# Patient Record
Sex: Female | Born: 1965 | Hispanic: Yes | Marital: Married | Smoking: Never smoker
Health system: Southern US, Community
[De-identification: ages and names within clinical notes are randomized; demographics above are authoritative.]

## PROBLEM LIST (undated history)

## (undated) DIAGNOSIS — I10 Essential (primary) hypertension: Secondary | ICD-10-CM

## (undated) HISTORY — DX: Essential (primary) hypertension: I10

## (undated) HISTORY — PX: NOSE SURGERY: SHX723

## (undated) HISTORY — PX: TUBAL LIGATION: SHX77

## (undated) HISTORY — PX: ABDOMINAL HYSTERECTOMY: SHX81

## (undated) HISTORY — PX: OTHER SURGICAL HISTORY: SHX169

---

## 2018-12-18 DIAGNOSIS — J019 Acute sinusitis, unspecified: Secondary | ICD-10-CM | POA: Diagnosis not present

## 2018-12-18 DIAGNOSIS — J301 Allergic rhinitis due to pollen: Secondary | ICD-10-CM | POA: Diagnosis not present

## 2018-12-18 DIAGNOSIS — J3489 Other specified disorders of nose and nasal sinuses: Secondary | ICD-10-CM | POA: Diagnosis not present

## 2019-01-06 ENCOUNTER — Encounter: Payer: Self-pay | Admitting: Family Medicine

## 2019-01-06 ENCOUNTER — Ambulatory Visit: Payer: Medicaid Other | Admitting: Family Medicine

## 2019-01-06 ENCOUNTER — Other Ambulatory Visit: Payer: Self-pay

## 2019-01-06 VITALS — BP 134/85 | HR 97 | Temp 98.3°F | Ht 62.0 in | Wt 131.0 lb

## 2019-01-06 DIAGNOSIS — E010 Iodine-deficiency related diffuse (endemic) goiter: Secondary | ICD-10-CM | POA: Diagnosis not present

## 2019-01-06 DIAGNOSIS — R6889 Other general symptoms and signs: Secondary | ICD-10-CM

## 2019-01-06 DIAGNOSIS — I10 Essential (primary) hypertension: Secondary | ICD-10-CM

## 2019-01-06 DIAGNOSIS — R1310 Dysphagia, unspecified: Secondary | ICD-10-CM | POA: Diagnosis not present

## 2019-01-06 DIAGNOSIS — R0989 Other specified symptoms and signs involving the circulatory and respiratory systems: Secondary | ICD-10-CM

## 2019-01-06 DIAGNOSIS — Z7689 Persons encountering health services in other specified circumstances: Secondary | ICD-10-CM

## 2019-01-06 MED ORDER — AMLODIPINE BESYLATE 5 MG PO TABS: 5.0000 mg | ORAL_TABLET | Freq: Every day | ORAL | 1 refills | Status: DC

## 2019-01-06 MED ORDER — PANTOPRAZOLE SODIUM 40 MG PO TBEC: 40.0000 mg | DELAYED_RELEASE_TABLET | Freq: Every day | ORAL | 3 refills | Status: DC

## 2019-01-06 NOTE — Progress Notes (Signed)
BP 134/85   Pulse 97   Temp 98.3 F (36.8 C) (Oral)   Ht 5\' 2"  (1.575 m)   Wt 131 lb (59.4 kg)   SpO2 98%   BMI 23.96 kg/m    Subjective:    Patient ID: Mandy Alexander, female    DOB: [birth year], 53 y.o.   MRN: [id number]  HPI: Mandy Alexander is a 53 y.o. female  Chief Complaint  Patient presents with  . Establish Care    pt states having difficulty swallowing/eating  . Medication Refill    amlodipine 5 mg. pt states she has been out of medication since May   Patient presents today to establish care. She is deaf and we are using the virtual medical translator for sign language today.   Only known medical problem is HTN, for which she has been under good control with amlodipine 5 mg. Has been out of the medication for 2 months and since has had some high readings. Denies CP, SOB, HAs, dizziness. Does have a BP cuff at home.   Main concern today is difficulty swallowing, especially meats or chicken. This started about 6 months ago and has progressively worsened. No thyroid or GI problems known in the past, no unintentional weight loss, hx of smoking, drug use, excessive alcohol use (socially drinks wine about once a month), reflux sxs, early satiety, N/V/D, constipation, abdominal pain. Has not tried anything for sxs or sought care up to this point for this.   Last CPE was 05/2018.   Relevant past medical, surgical, family and social history reviewed and updated as indicated. Interim medical history since our last visit reviewed. Allergies and medications reviewed and updated.  Review of Systems  Per HPI unless specifically indicated above     Objective:    BP 134/85   Pulse 97   Temp 98.3 F (36.8 C) (Oral)   Ht 5\' 2"  (1.575 m)   Wt 131 lb (59.4 kg)   SpO2 98%   BMI 23.96 kg/m   Wt Readings from Last 3 Encounters:  01/06/19 131 lb (59.4 kg)    Physical Exam Vitals signs and nursing note reviewed.  Constitutional:      Appearance: Normal appearance. She is  not ill-appearing.  HENT:     Head: Atraumatic.     Mouth/Throat:     Pharynx: Oropharynx is clear. No posterior oropharyngeal erythema.  Eyes:     Extraocular Movements: Extraocular movements intact.     Conjunctiva/sclera: Conjunctivae normal.  Neck:     Musculoskeletal: Normal range of motion and neck supple.     Comments: Diffuse thyromegaly noted on exam, nontender and no obvious nodules palpable. Symmetric with swallowing Cardiovascular:     Rate and Rhythm: Normal rate and regular rhythm.     Heart sounds: Normal heart sounds.  Pulmonary:     Effort: Pulmonary effort is normal.     Breath sounds: Normal breath sounds.  Abdominal:     General: Bowel sounds are normal.     Palpations: Abdomen is soft.     Tenderness: There is no abdominal tenderness. There is no guarding.  Musculoskeletal: Normal range of motion.  Lymphadenopathy:     Cervical: No cervical adenopathy.  Skin:    General: Skin is warm and dry.  Neurological:     Mental Status: She is alert and oriented to person, place, and time.  Psychiatric:        Mood and Affect: Mood normal.  Thought Content: Thought content normal.        Judgment: Judgment normal.     Results for orders placed or performed in visit on 01/06/19  CBC with Differential/Platelet  Result Value Ref Range   WBC 8.9 3.4 - 10.8 x10E3/uL   RBC 4.37 3.77 - 5.28 x10E6/uL   Hemoglobin 12.1 11.1 - 15.9 g/dL   Hematocrit 36.0 34.0 - 46.6 %   MCV 82 79 - 97 fL   MCH 27.7 26.6 - 33.0 pg   MCHC 33.6 31.5 - 35.7 g/dL   RDW 12.9 11.7 - 15.4 %   Platelets 314 150 - 450 x10E3/uL   Neutrophils 80 Not Estab. %   Lymphs 14 Not Estab. %   Monocytes 5 Not Estab. %   Eos 1 Not Estab. %   Basos 0 Not Estab. %   Neutrophils Absolute 7.1 (H) 1.4 - 7.0 x10E3/uL   Lymphocytes Absolute 1.2 0.7 - 3.1 x10E3/uL   Monocytes Absolute 0.5 0.1 - 0.9 x10E3/uL   EOS (ABSOLUTE) 0.0 0.0 - 0.4 x10E3/uL   Basophils Absolute 0.0 0.0 - 0.2 x10E3/uL   Immature  Granulocytes 0 Not Estab. %   Immature Grans (Abs) 0.0 0.0 - 0.1 x10E3/uL  TSH  Result Value Ref Range   TSH <0.006 (L) 0.450 - 4.500 uIU/mL      Assessment & Plan:   Problem List Items Addressed This Visit      Cardiovascular and Mediastinum   Essential hypertension - Primary    Restart amlodipine and recheck in 2 weeks. Check home readings and call with persistent abnormal readings or side effects      Relevant Medications   amLODipine (NORVASC) 5 MG tablet    Other Visit Diagnoses    Encounter to establish care       Throat fullness       Relevant Orders   CBC with Differential/Platelet (Completed)   Thyromegaly       Will obtain TSH, no known hx of thyroid dz. Will proceed based on results.    Relevant Orders   TSH (Completed)   Dysphagia, unspecified type       Potentially due to thyromegaly but will start protonix in case reflux inflammation and refer to GI if thyroid studies WNL and issue persists       Follow up plan: Return in about 2 weeks (around 01/20/2019) for BP, swallowing f/u.

## 2019-01-07 ENCOUNTER — Telehealth: Payer: Self-pay | Admitting: Family Medicine

## 2019-01-07 DIAGNOSIS — I10 Essential (primary) hypertension: Secondary | ICD-10-CM | POA: Insufficient documentation

## 2019-01-07 DIAGNOSIS — E010 Iodine-deficiency related diffuse (endemic) goiter: Secondary | ICD-10-CM

## 2019-01-07 DIAGNOSIS — R1310 Dysphagia, unspecified: Secondary | ICD-10-CM

## 2019-01-07 DIAGNOSIS — E0590 Thyrotoxicosis, unspecified without thyrotoxic crisis or storm: Secondary | ICD-10-CM

## 2019-01-07 LAB — CBC WITH DIFFERENTIAL/PLATELET
Basophils Absolute: 0 10*3/uL (ref 0.0–0.2)
Basos: 0 %
EOS (ABSOLUTE): 0 10*3/uL (ref 0.0–0.4)
Eos: 1 %
Hematocrit: 36 % (ref 34.0–46.6)
Hemoglobin: 12.1 g/dL (ref 11.1–15.9)
Immature Grans (Abs): 0 10*3/uL (ref 0.0–0.1)
Immature Granulocytes: 0 %
Lymphocytes Absolute: 1.2 10*3/uL (ref 0.7–3.1)
Lymphs: 14 %
MCH: 27.7 pg (ref 26.6–33.0)
MCHC: 33.6 g/dL (ref 31.5–35.7)
MCV: 82 fL (ref 79–97)
Monocytes Absolute: 0.5 10*3/uL (ref 0.1–0.9)
Monocytes: 5 %
Neutrophils Absolute: 7.1 10*3/uL — ABNORMAL HIGH (ref 1.4–7.0)
Neutrophils: 80 %
Platelets: 314 10*3/uL (ref 150–450)
RBC: 4.37 x10E6/uL (ref 3.77–5.28)
RDW: 12.9 % (ref 11.7–15.4)
WBC: 8.9 10*3/uL (ref 3.4–10.8)

## 2019-01-07 LAB — TSH: TSH: <0.006 u[IU]/mL — ABNORMAL LOW (ref 0.450–4.500)

## 2019-01-07 MED ORDER — METHIMAZOLE 5 MG PO TABS: 5.0000 mg | ORAL_TABLET | Freq: Three times a day (TID) | ORAL | 0 refills | Status: DC

## 2019-01-07 NOTE — Telephone Encounter (Signed)
Message relayed to [employer], son, as pt is deaf. Son verbalized understanding and denied questions. Will call back if his mother has any questions or concerns.

## 2019-01-07 NOTE — Telephone Encounter (Signed)
Please let Mandy Alexander know that Mandy Alexander thyroid level was significantly low so I have ordered some ultrasound testing on Mandy Alexander thyroid as well as an urgent referral to see an Endocrinologist. I would also like for Mandy Alexander to start methimazole to help with this until she sees the specialist.   Judson Roch - note that I placed an urgent Endocrinology referral for Mandy Alexander. Thanks!

## 2019-01-07 NOTE — Assessment & Plan Note (Signed)
Restart amlodipine and recheck in 2 weeks. Check home readings and call with persistent abnormal readings or side effects

## 2019-01-21 ENCOUNTER — Encounter: Admission: RE | Admit: 2019-01-21 | Payer: Medicaid Other | Source: Ambulatory Visit

## 2019-01-21 ENCOUNTER — Other Ambulatory Visit: Payer: Self-pay

## 2019-01-21 ENCOUNTER — Ambulatory Visit
Admission: RE | Admit: 2019-01-21 | Discharge: 2019-01-21 | Disposition: A | Discharge status: Home / Self Care | Payer: Medicaid Other | Source: Ambulatory Visit | Attending: Family Medicine | Admitting: Family Medicine

## 2019-01-21 DIAGNOSIS — E0590 Thyrotoxicosis, unspecified without thyrotoxic crisis or storm: Secondary | ICD-10-CM | POA: Insufficient documentation

## 2019-01-21 DIAGNOSIS — R1310 Dysphagia, unspecified: Secondary | ICD-10-CM | POA: Insufficient documentation

## 2019-01-21 DIAGNOSIS — E010 Iodine-deficiency related diffuse (endemic) goiter: Secondary | ICD-10-CM | POA: Insufficient documentation

## 2019-01-22 ENCOUNTER — Ambulatory Visit: Payer: Medicaid Other | Admitting: Family Medicine

## 2019-01-22 ENCOUNTER — Telehealth: Payer: Self-pay | Admitting: Family Medicine

## 2019-01-22 ENCOUNTER — Ambulatory Visit: Admission: RE | Admit: 2019-01-22 | Payer: Medicaid Other | Source: Ambulatory Visit

## 2019-01-22 NOTE — Telephone Encounter (Signed)
Please call patient and remind her that her u/s was today and have them reschedule for as soon as possible  Copied from [place] [id number]. Topic: General - Other >> Jan 22, 2019 11:06 AM Berneta Levins wrote: Reason for CRM:   Maudie Mercury from [facility] ultrasound department calling to report that pt did not show for thyroid ultrasound today. Maudie Mercury can be reached at [phone]

## 2019-01-22 NOTE — Telephone Encounter (Signed)
Called pt's son Vira Agar to advise of provider's message, he states that her u/s has already been rescheduled.

## 2019-02-04 ENCOUNTER — Encounter
Admission: RE | Admit: 2019-02-04 | Discharge: 2019-02-04 | Disposition: A | Discharge status: Home / Self Care | Payer: Medicaid Other | Source: Ambulatory Visit | Attending: Family Medicine | Admitting: Family Medicine

## 2019-02-04 ENCOUNTER — Other Ambulatory Visit: Payer: Self-pay

## 2019-02-04 DIAGNOSIS — E0590 Thyrotoxicosis, unspecified without thyrotoxic crisis or storm: Secondary | ICD-10-CM | POA: Insufficient documentation

## 2019-02-04 DIAGNOSIS — E010 Iodine-deficiency related diffuse (endemic) goiter: Secondary | ICD-10-CM | POA: Diagnosis not present

## 2019-02-04 DIAGNOSIS — R1310 Dysphagia, unspecified: Secondary | ICD-10-CM

## 2019-02-04 MED ORDER — SODIUM IODIDE I-123 7.4 MBQ CAPS
438.7690 | ORAL_CAPSULE | Freq: Once | ORAL | Status: AC
  Administered 2019-02-04: 09:00:00 438.769 via ORAL

## 2019-02-05 ENCOUNTER — Encounter
Admission: RE | Admit: 2019-02-05 | Discharge: 2019-02-05 | Disposition: A | Discharge status: Home / Self Care | Payer: Medicaid Other | Source: Ambulatory Visit | Attending: Family Medicine | Admitting: Family Medicine

## 2019-02-05 DIAGNOSIS — R1310 Dysphagia, unspecified: Secondary | ICD-10-CM | POA: Insufficient documentation

## 2019-02-05 DIAGNOSIS — E0590 Thyrotoxicosis, unspecified without thyrotoxic crisis or storm: Secondary | ICD-10-CM | POA: Diagnosis not present

## 2019-02-05 DIAGNOSIS — E010 Iodine-deficiency related diffuse (endemic) goiter: Secondary | ICD-10-CM | POA: Insufficient documentation

## 2019-02-07 ENCOUNTER — Other Ambulatory Visit: Payer: Self-pay | Admitting: Family Medicine

## 2019-02-07 NOTE — Telephone Encounter (Signed)
Requested medication (s) are due for refill today: yes  Requested medication (s) are on the active medication list: yes  Last refill:  01/07/19  Future visit scheduled: yes  Notes to clinic:  Medication not delegated to NT to refill   Requested Prescriptions  Pending Prescriptions Disp Refills   methimazole (TAPAZOLE) 5 MG tablet [Pharmacy Med Name: METHIMAZOLE 5 MG TABLET] 90 tablet 0    Sig: TAKE 1 TABLET BY MOUTH THREE TIMES A DAY     Not Delegated - Endocrinology:  Hyperthyroid Agents Failed - 02/07/2019  2:31 PM      Failed - This refill cannot be delegated      Failed - TSH in normal range and within 180 days    TSH  Date Value Ref Range Status  01/06/2019 <0.006 (L) 0.450 - 4.500 uIU/mL Final         Passed - Valid encounter within last 6 months    Recent Outpatient Visits          1 month ago Essential hypertension   [facility] [place], [place]      Future Appointments            In 4 days Orene Desanctis, Lilia Argue, [place], [place]

## 2019-02-08 ENCOUNTER — Ambulatory Visit: Payer: Medicaid Other | Admitting: Family Medicine

## 2019-02-08 NOTE — Telephone Encounter (Signed)
Routing to provider  

## 2019-02-10 ENCOUNTER — Ambulatory Visit: Payer: Medicaid Other | Admitting: Family Medicine

## 2019-02-11 ENCOUNTER — Other Ambulatory Visit: Payer: Self-pay

## 2019-02-11 ENCOUNTER — Ambulatory Visit: Payer: Medicaid Other | Admitting: Family Medicine

## 2019-02-11 ENCOUNTER — Encounter: Payer: Self-pay | Admitting: Family Medicine

## 2019-02-11 VITALS — BP 127/83 | HR 120 | Temp 98.2°F | Ht 62.0 in | Wt 132.0 lb

## 2019-02-11 DIAGNOSIS — Z20828 Contact with and (suspected) exposure to other viral communicable diseases: Secondary | ICD-10-CM | POA: Diagnosis not present

## 2019-02-11 DIAGNOSIS — Z20822 Contact with and (suspected) exposure to covid-19: Secondary | ICD-10-CM

## 2019-02-11 DIAGNOSIS — E0590 Thyrotoxicosis, unspecified without thyrotoxic crisis or storm: Secondary | ICD-10-CM

## 2019-02-11 NOTE — Patient Instructions (Signed)
[employer] [facility] [place], [place] Across the parking lot from [facility]  The testing site is open from 8:30 - 3:30 on weekdays

## 2019-02-11 NOTE — Progress Notes (Signed)
BP 127/83   Pulse (!) 120   Temp 98.2 F (36.8 C) (Oral)   Ht 5\' 2"  (1.575 m)   Wt 132 lb (59.9 kg)   SpO2 95%   BMI 24.14 kg/m    Subjective:    Patient ID: Mandy Alexander, female    DOB: [birth year], 53 y.o.   MRN: [id number]  HPI: Mandy Alexander is a 53 y.o. female  Chief Complaint  Patient presents with  . Hypertension    f/u  . Swallowing problem   Patient presents today with her son, who acts as interpreter for sign language today per patient request. She declines a medical translator for today's visit.   She is following up today for 1 month hypothyroidism f/u. Started on low dose methimazole and awaiting est care with Endocrinology which is scheduled for next month. Does not feel any different at this time. Still having some dysphagia and occasional palpitations.   Will soon travel to the [place] to visit family and has been exposed to [place] through some contacts recently. Wanting to get tested prior to trip to make sure she isn't exposing her family over there to danger. Denies any sick sxs.   Relevant past medical, surgical, family and social history reviewed and updated as indicated. Interim medical history since our last visit reviewed. Allergies and medications reviewed and updated.  Review of Systems  Per HPI unless specifically indicated above     Objective:    BP 127/83   Pulse (!) 120   Temp 98.2 F (36.8 C) (Oral)   Ht 5\' 2"  (1.575 m)   Wt 132 lb (59.9 kg)   SpO2 95%   BMI 24.14 kg/m   Wt Readings from Last 3 Encounters:  02/11/19 132 lb (59.9 kg)  01/06/19 131 lb (59.4 kg)    Physical Exam Vitals signs and nursing note reviewed.  Constitutional:      Appearance: Normal appearance. She is not ill-appearing.  HENT:     Head: Atraumatic.  Eyes:     Extraocular Movements: Extraocular movements intact.     Conjunctiva/sclera: Conjunctivae normal.  Neck:     Musculoskeletal: Normal range of motion and neck supple.   Comments: Diffuse thyromegaly, no palpable nodules Cardiovascular:     Rate and Rhythm: Normal rate and regular rhythm.     Heart sounds: Normal heart sounds.  Pulmonary:     Effort: Pulmonary effort is normal.     Breath sounds: Normal breath sounds.  Musculoskeletal: Normal range of motion.  Skin:    General: Skin is warm and dry.  Neurological:     Mental Status: She is alert and oriented to person, place, and time.  Psychiatric:        Mood and Affect: Mood normal.        Thought Content: Thought content normal.        Judgment: Judgment normal.     Results for orders placed or performed in visit on 02/11/19  TSH  Result Value Ref Range   TSH <0.005 (L) 0.450 - 4.500 uIU/mL      Assessment & Plan:   Problem List Items Addressed This Visit      Endocrine   Hyperthyroidism - Primary    Recheck TSH, adjust as needed. Await establish care visit with Endocrinology set for next month      Relevant Orders   TSH (Completed)    Other Visit Diagnoses    Exposure to Covid-19 Virus  Will refer for testing and quarantine until results are back. F/u if becoming symptomatic       Follow up plan: Return in about 5 months (around 07/14/2019) for BP.

## 2019-02-12 ENCOUNTER — Encounter: Payer: Self-pay | Admitting: Family Medicine

## 2019-02-12 ENCOUNTER — Other Ambulatory Visit: Payer: Self-pay | Admitting: Family Medicine

## 2019-02-12 LAB — TSH: TSH: <0.005 u[IU]/mL — ABNORMAL LOW (ref 0.450–4.500)

## 2019-02-12 MED ORDER — METHIMAZOLE 10 MG PO TABS: 10.0000 mg | ORAL_TABLET | Freq: Three times a day (TID) | ORAL | 0 refills | Status: DC

## 2019-02-13 LAB — SPECIMEN STATUS REPORT

## 2019-02-13 LAB — NOVEL CORONAVIRUS, NAA: SARS-CoV-2, NAA: NOT DETECTED

## 2019-02-15 DIAGNOSIS — E0590 Thyrotoxicosis, unspecified without thyrotoxic crisis or storm: Secondary | ICD-10-CM | POA: Insufficient documentation

## 2019-02-15 NOTE — Assessment & Plan Note (Signed)
Recheck TSH, adjust as needed. Await establish care visit with Endocrinology set for next month

## 2019-03-05 DIAGNOSIS — E0500 Thyrotoxicosis with diffuse goiter without thyrotoxic crisis or storm: Secondary | ICD-10-CM | POA: Diagnosis not present

## 2019-04-28 DIAGNOSIS — E0500 Thyrotoxicosis with diffuse goiter without thyrotoxic crisis or storm: Secondary | ICD-10-CM | POA: Diagnosis not present

## 2019-12-09 ENCOUNTER — Other Ambulatory Visit: Payer: Self-pay | Admitting: Family Medicine

## 2019-12-09 MED ORDER — METHIMAZOLE 10 MG PO TABS: 10.0000 mg | ORAL_TABLET | Freq: Three times a day (TID) | ORAL | 0 refills | Status: DC

## 2019-12-09 MED ORDER — AMLODIPINE BESYLATE 5 MG PO TABS: 5.0000 mg | ORAL_TABLET | Freq: Every day | ORAL | 0 refills | Status: DC

## 2019-12-09 NOTE — Telephone Encounter (Signed)
Copied from [place] [id number]. Topic: Quick Communication - Rx Refill/Question >> Dec 09, 2019  9:13 AM Rainey Pines A wrote: Medication: methimazole (TAPAZOLE) 10 MG tablet,amLODipine (NORVASC) 5 MG tablet  Has the patient contacted their pharmacy? yes (Agent: If no, request that the patient contact the pharmacy for the refill.) (Agent: If yes, when and what did the pharmacy advise?)contact pcp  Preferred Pharmacy (with phone number or street name):[id number] [address], [place]  Phone:  [phone] Fax:  [phone]     Agent: Please be advised that RX refills may take up to 3 business days. We ask that you follow-up with your pharmacy.

## 2020-05-19 DIAGNOSIS — R197 Diarrhea, unspecified: Secondary | ICD-10-CM | POA: Diagnosis not present

## 2020-05-23 ENCOUNTER — Ambulatory Visit (INDEPENDENT_AMBULATORY_CARE_PROVIDER_SITE_OTHER): Payer: Medicaid Other | Admitting: Family Medicine

## 2020-05-23 ENCOUNTER — Encounter: Payer: Self-pay | Admitting: Family Medicine

## 2020-05-23 ENCOUNTER — Other Ambulatory Visit: Payer: Self-pay

## 2020-05-23 VITALS — BP 137/90 | HR 82 | Temp 97.8°F | Wt 141.6 lb

## 2020-05-23 DIAGNOSIS — E0590 Thyrotoxicosis, unspecified without thyrotoxic crisis or storm: Secondary | ICD-10-CM

## 2020-05-23 DIAGNOSIS — I10 Essential (primary) hypertension: Secondary | ICD-10-CM

## 2020-05-23 DIAGNOSIS — Z1159 Encounter for screening for other viral diseases: Secondary | ICD-10-CM

## 2020-05-23 DIAGNOSIS — R195 Other fecal abnormalities: Secondary | ICD-10-CM | POA: Diagnosis not present

## 2020-05-23 DIAGNOSIS — Z114 Encounter for screening for human immunodeficiency virus [HIV]: Secondary | ICD-10-CM

## 2020-05-23 MED ORDER — AMLODIPINE BESYLATE 5 MG PO TABS: 5.0000 mg | ORAL_TABLET | Freq: Every day | ORAL | 2 refills | Status: DC

## 2020-05-23 MED ORDER — METHIMAZOLE 10 MG PO TABS: 10.0000 mg | ORAL_TABLET | Freq: Every day | ORAL | 2 refills | Status: DC

## 2020-05-23 NOTE — Assessment & Plan Note (Signed)
Acute, unclear etiology at this time. May be related to Graves disease as she exhibits other symptoms, obtaining labs today. Extensive work up in ED in [place] with stool studies, labs, CT A/P, will obtain records. Well appearing and afebrile today, unlikely to be infectious in origin. Recommend adequate hydration and hand hygiene. F/u in 4-6 weeks once records are available.

## 2020-05-23 NOTE — Progress Notes (Signed)
   SUBJECTIVE:   CHIEF COMPLAINT / HPI:   Hyperthyroidism - Medications: methimazole 10mg  daily - Denies changes in vision, headache, tachycardia, heat intolerance, irritability, jitteriness, poor eating, weight loss, palpitations, eye pain, neck pain.  - Current symptoms: some cold intolerance, trouble sleeping, a little hair loss, increase in stool frequency, loose stools. - Prior studies include TSH <0.006 uIU/ml, thyroid uptake scan consistent with Graves' Disease.  Thyroid US with no nodules or abnormalities.  - previously evaluated by Endo 03/2019, doesn't have f/u scheduled.   Loose stools - Recently went to ED in [place] on 11/19 for diarrhea. Had stool studies, labs and CT A/P done, unsure of results.  - Reports ongoing multiple loose stools per day. - Denies blood in stool, N/V, abdominal pain, trouble eating. Staying hydrated.  Hypertension: - Medications: amlodipine 5mg  daily - Compliance: has been out for a month.  - Checking BP at home: occasionally - Denies any SOB, CP, vision changes, LE edema, medication SEs, or symptoms of hypotension  Healthcare Maintenance - Vaccines: UTD with COVID, flu. - Colonoscopy: had 2 years ago, [place] or [facility], [place]. Reports having had biopsy done, unsure of results.  - Mammogram: 1 year ago in [place].  - Pap Smear: about 1 year ago in [place].  [facility], Sidney Ace MD.   PERTINENT  PMH / PSH: HTN, hyperthyroidism  OBJECTIVE:   BP 137/90 (BP Location: Right Arm, Cuff Size: Normal)   Pulse 82   Temp 97.8 F (36.6 C) (Oral)   Wt 141 lb 9.6 oz (64.2 kg)   SpO2 99%   BMI 25.90 kg/m   Gen: well appearing, in NAD HEENT: slight thyromegaly, no nodules appreciated. No lymphadenopathy, exophthalmos.  Cardiac: RRR, no murmur Lungs: CTAB Abd: soft, NTND, +BS. No hepatosplenomegaly. No rebound tenderness or guarding.  ASSESSMENT/PLAN:   Essential hypertension Slightly above goal today, has been out of meds  for about a month. Refills sent. Obtaining BMP.  Hyperthyroidism Chronic, currently symptomatic. Will check labs today and adjust dosage as needed. Recommended endo f/u per their previous plan.  Loose stools Acute, unclear etiology at this time. May be related to Graves disease as she exhibits other symptoms, obtaining labs today. Extensive work up in ED in [place] with stool studies, labs, CT A/P, will obtain records. Well appearing and afebrile today, unlikely to be infectious in origin. Recommend adequate hydration and hand hygiene. F/u in 4-6 weeks once records are available.    Myles Gip, DO

## 2020-05-23 NOTE — Assessment & Plan Note (Addendum)
Slightly above goal today, has been out of meds for about a month. Refills sent. Obtaining BMP.

## 2020-05-23 NOTE — Patient Instructions (Addendum)
It was great to see you!  Our plans for today:  - We sent refills of your medications to the pharmacy. We will let you know if we need to adjust the dose. - Call the endocrinologist at [phone] to schedule a follow up appointment.  - We are obtaining your records from your recent ED visit as well as your prior doctor in [place]. - Make sure you are drinking plenty of water to keep from getting dehydrated. - Follow up in 4-6 weeks.   We are checking some labs today, we will release these to your MyChart.  Take care and seek immediate care sooner if you develop any concerns.   Dr. Ky Barban

## 2020-05-23 NOTE — Assessment & Plan Note (Signed)
Chronic, currently symptomatic. Will check labs today and adjust dosage as needed. Recommended endo f/u per their previous plan.

## 2020-05-25 LAB — BASIC METABOLIC PANEL
BUN/Creatinine Ratio: 15 (ref 9–23)
BUN: 12 mg/dL (ref 6–24)
CO2: 21 mmol/L (ref 20–29)
Calcium: 9.4 mg/dL (ref 8.7–10.2)
Chloride: 104 mmol/L (ref 96–106)
Creatinine, Ser: 0.79 mg/dL (ref 0.57–1.00)
GFR calc Af Amer: 100 mL/min/{1.73_m2} (ref >59)
GFR calc non Af Amer: 86 mL/min/{1.73_m2} (ref >59)
Glucose: 83 mg/dL (ref 65–99)
Potassium: 3.6 mmol/L (ref 3.5–5.2)
Sodium: 142 mmol/L (ref 134–144)

## 2020-05-25 LAB — HEPATITIS C ANTIBODY: Hep C Virus Ab: <0.1 s/co ratio (ref 0.0–0.9)

## 2020-05-25 LAB — T3, FREE: T3, Free: 2.9 pg/mL (ref 2.0–4.4)

## 2020-05-25 LAB — T4: T4, Total: 9.9 ug/dL (ref 4.5–12.0)

## 2020-05-25 LAB — HIV ANTIBODY (ROUTINE TESTING W REFLEX): HIV Screen 4th Generation wRfx: NONREACTIVE

## 2020-05-25 LAB — TSH: TSH: 0.93 u[IU]/mL (ref 0.450–4.500)

## 2020-06-06 ENCOUNTER — Ambulatory Visit (INDEPENDENT_AMBULATORY_CARE_PROVIDER_SITE_OTHER): Payer: Medicaid Other | Admitting: Family Medicine

## 2020-06-06 ENCOUNTER — Encounter: Payer: Self-pay | Admitting: Family Medicine

## 2020-06-06 ENCOUNTER — Other Ambulatory Visit: Payer: Self-pay

## 2020-06-06 VITALS — BP 132/88 | HR 87 | Temp 98.4°F | Wt 140.2 lb

## 2020-06-06 DIAGNOSIS — I10 Essential (primary) hypertension: Secondary | ICD-10-CM | POA: Diagnosis not present

## 2020-06-06 DIAGNOSIS — R195 Other fecal abnormalities: Secondary | ICD-10-CM

## 2020-06-06 MED ORDER — LOPERAMIDE HCL 2 MG PO TABS: 4.0000 mg | ORAL_TABLET | Freq: Three times a day (TID) | ORAL | 0 refills | Status: DC | PRN

## 2020-06-06 NOTE — Assessment & Plan Note (Signed)
At goal on current regimen, no changes made today.

## 2020-06-06 NOTE — Patient Instructions (Addendum)
It was great to see you!  Our plans for today:  - Take the imodium as needed for the next couple of days. If your diarrhea worsens after stopping this medication or if this medication doesn't help, let us know. - I will look into getting your colonoscopy records from your prior doctor.  - Call the endocrinologist at [phone] to schedule a follow up appointment.  - Keep a log of foods you eat and when you have diarrhea to see if there are any associated triggers.  - Try limiting dairy and caffeine to see if these help.   Take care and seek immediate care sooner if you develop any concerns.   Dr. Ky Barban   Diarrhea, Adult Diarrhea is frequent loose and watery bowel movements. Diarrhea can make you feel weak and cause you to become dehydrated. Dehydration can make you tired and thirsty, cause you to have a dry mouth, and decrease how often you urinate. Diarrhea typically lasts 2-3 days. However, it can last longer if it is a sign of something more serious. It is important to treat your diarrhea as told by your health care provider. Follow these instructions at home: Eating and drinking     Follow these recommendations as told by your health care provider:  Take an oral rehydration solution (ORS). This is an over-the-counter medicine that helps return your body to its normal balance of nutrients and water. It is found at pharmacies and retail stores.  Drink plenty of fluids, such as water, ice chips, diluted fruit juice, and low-calorie sports drinks. You can drink milk also, if desired.  Avoid drinking fluids that contain a lot of sugar or caffeine, such as energy drinks, sports drinks, and soda.  Eat bland, easy-to-digest foods in small amounts as you are able. These foods include bananas, applesauce, rice, lean meats, toast, and crackers.  Avoid alcohol.  Avoid spicy or fatty foods.  Medicines  Take over-the-counter and prescription medicines only as told by your health care  provider.  If you were prescribed an antibiotic medicine, take it as told by your health care provider. Do not stop using the antibiotic even if you start to feel better. General instructions   Wash your hands often using soap and water. If soap and water are not available, use a hand sanitizer. Others in the household should wash their hands as well. Hands should be washed: ? After using the toilet or changing a diaper. ? Before preparing, cooking, or serving food. ? While caring for a sick person or while visiting someone in a hospital.  Drink enough fluid to keep your urine pale yellow.  Rest at home while you recover.  Watch your condition for any changes.  Take a warm bath to relieve any burning or pain from frequent diarrhea episodes.  Keep all follow-up visits as told by your health care provider. This is important. Contact a health care provider if:  You have a fever.  Your diarrhea gets worse.  You have new symptoms.  You cannot keep fluids down.  You feel light-headed or dizzy.  You have a headache.  You have muscle cramps. Get help right away if:  You have chest pain.  You feel extremely weak or you faint.  You have bloody or black stools or stools that look like tar.  You have severe pain, cramping, or bloating in your abdomen.  You have trouble breathing or you are breathing very quickly.  Your heart is beating very quickly.  Your  skin feels cold and clammy.  You feel confused.  You have signs of dehydration, such as: ? Dark urine, very little urine, or no urine. ? Cracked lips. ? Dry mouth. ? Sunken eyes. ? Sleepiness. ? Weakness. Summary  Diarrhea is frequent loose and watery bowel movements. Diarrhea can make you feel weak and cause you to become dehydrated.  Drink enough fluids to keep your urine pale yellow.  Make sure that you wash your hands after using the toilet. If soap and water are not available, use hand sanitizer.  Contact  a health care provider if your diarrhea gets worse or you have new symptoms.  Get help right away if you have signs of dehydration. This information is not intended to replace advice given to you by your health care provider. Make sure you discuss any questions you have with your health care provider. Document Revised: 11/03/2018 Document Reviewed: 11/21/2017 Elsevier Patient Education  [place].

## 2020-06-06 NOTE — Assessment & Plan Note (Addendum)
Ongoing. Recent CT A/P without acute pathology. CBC, CMP wnl. C diff and O&P negative. Hyperthyroidism controlled on current regimen. Will have patient limit bowel stimulating foods (caffeine, spicy) and trial limiting dairy. Use desitin or vaseline in rectal area to prevent irritation. Keep a log of foods eaten and when episodes occur. Will trial short course of imodium given lack of infectious source. If no improvement, consider GI referral.

## 2020-06-06 NOTE — Progress Notes (Signed)
   SUBJECTIVE:   CHIEF COMPLAINT / HPI:   Past Medical History:  Diagnosis Date  . Hypertension    Hypertension: - Medications: amlodipine 5mg  daily - Compliance: good - Checking BP at home: no - Denies any SOB, chest pain, LE edema, medication SEs, or symptoms of hypotension  Loose stools - seen previously 11/23 for OV as well as ED in [place] on 11/19. - ED workup: CT A/P without acute pathology, O&P and Cdiff negative, CBC wnl. Duration: 1 month  Pain with bowel movements, only in rectal area, thinks its from irritation. No abd pain. No blood in stool. Has BMs 6-7 times per day. Types 5-6 on BSC. Nature: bloating Radiation: no Frequency: constant Alleviating factors: none Aggravating factors: none Treatments attempted: none Constipation: no Diarrhea: yes Episodes of diarrhea/day: 6-7 Mucous in the stool: unsure Heartburn: no Bloating:yes, comes and goes Nausea: no Vomiting: no Episodes of vomit/day: n/a Melena or hematochezia: no Rash: no Jaundice: no Fever: no Weight loss: no  No prior abdominal surgeries No issues urinating or abnl vaginal discharge.  No recent travel or illnesses.   OBJECTIVE:   BP 132/88   Pulse 87   Temp 98.4 F (36.9 C)   Wt 140 lb 4 oz (63.6 kg)   SpO2 98%   BMI 25.65 kg/m   Gen: well appearing, in NAD Cardiac: RRR, no murmur Lungs: CTAB Abd: soft, slightly tender suprapubically. Nondistended. +BS. Negative murphy's sign. No rebound tenderness or guarding. No jaundice. Skin: no rashes.  ASSESSMENT/PLAN:   Loose stools Ongoing. Recent CT A/P without acute pathology. CBC, CMP wnl. C diff and O&P negative. Hyperthyroidism controlled on current regimen. Will have patient limit bowel stimulating foods (caffeine, spicy) and trial limiting dairy. Use desitin or vaseline in rectal area to prevent irritation. Keep a log of foods eaten and when episodes occur. Will trial short course of imodium given lack of infectious source. If no  improvement, consider GI referral.   Essential hypertension At goal on current regimen, no changes made today.    Myles Gip, DO

## 2020-06-20 ENCOUNTER — Ambulatory Visit: Payer: Medicaid Other | Admitting: Family Medicine

## 2020-12-29 HISTORY — PX: UTERINE FIBROID SURGERY: SHX826

## 2021-06-18 NOTE — Progress Notes (Signed)
BP (!) 146/85    Pulse 87    Temp 98 F (36.7 C) (Oral)    Ht '5\' 3"'  (1.6 m)    Wt 143 lb (64.9 kg)    SpO2 98%    BMI 25.33 kg/m    Subjective:    Patient ID: Mandy Alexander, female    DOB: [birth year], 55 y.o.   MRN: [id number]  HPI: Mandy Alexander is a 55 y.o. female  Chief Complaint  Patient presents with   Hypertension    Has been out of medication since 05/23/21   Hyperthyroidism   HYPERTENSION Hypertension status: uncontrolled - patient has been out of blood pressure medication for about a month. Satisfied with current treatment? no Duration of hypertension: years BP monitoring frequency:  not checking BP range:  BP medication side effects:  no Medication compliance: fair compliance Previous BP meds:amlodipine Aspirin: no Recurrent headaches: no Visual changes: no Palpitations: no Dyspnea: no Chest pain: no Lower extremity edema: no Dizzy/lightheaded: no  HYPERTHYROIDISM Thyroid control status:uncontrolled- has been out of medication for about a month Satisfied with current treatment? no Medication side effects: no Medication compliance: fair compliance Etiology of hypothyroidism:  Recent dose adjustment:no Fatigue: no Cold intolerance: no Heat intolerance: no Weight gain: no Weight loss: no Constipation: no Diarrhea/loose stools: no Palpitations: no Lower extremity edema: no Anxiety/depressed mood: no  Patient states she has been having dry itchy patches on her skin.  States they are dry and itching. Would like some cream to put on them.  Relevant past medical, surgical, family and social history reviewed and updated as indicated. Interim medical history since our last visit reviewed. Allergies and medications reviewed and updated.  Review of Systems  Constitutional:  Negative for fever and unexpected weight change.  Eyes:  Negative for visual disturbance.  Respiratory:  Negative for cough, chest tightness and shortness of breath.    Cardiovascular:  Negative for chest pain, palpitations and leg swelling.  Endocrine: Negative for cold intolerance.  Skin:        Dry itchy patches on skin  Neurological:  Negative for dizziness and headaches.   Per HPI unless specifically indicated above     Objective:    BP (!) 146/85    Pulse 87    Temp 98 F (36.7 C) (Oral)    Ht '5\' 3"'  (1.6 m)    Wt 143 lb (64.9 kg)    SpO2 98%    BMI 25.33 kg/m   Wt Readings from Last 3 Encounters:  06/20/21 143 lb (64.9 kg)  06/06/20 140 lb 4 oz (63.6 kg)  05/23/20 141 lb 9.6 oz (64.2 kg)    Physical Exam Vitals and nursing note reviewed.  Constitutional:      General: She is not in acute distress.    Appearance: Normal appearance. She is normal weight. She is not ill-appearing, toxic-appearing or diaphoretic.  HENT:     Head: Normocephalic.     Right Ear: External ear normal.     Left Ear: External ear normal.     Nose: Nose normal.     Mouth/Throat:     Mouth: Mucous membranes are moist.     Pharynx: Oropharynx is clear.  Eyes:     General:        Right eye: No discharge.        Left eye: No discharge.     Extraocular Movements: Extraocular movements intact.     Conjunctiva/sclera: Conjunctivae normal.     Pupils: Pupils  are equal, round, and reactive to light.  Cardiovascular:     Rate and Rhythm: Normal rate and regular rhythm.     Heart sounds: No murmur heard. Pulmonary:     Effort: Pulmonary effort is normal. No respiratory distress.     Breath sounds: Normal breath sounds. No wheezing or rales.  Musculoskeletal:     Cervical back: Normal range of motion and neck supple.  Skin:    General: Skin is warm and dry.     Capillary Refill: Capillary refill takes less than 2 seconds.       Neurological:     General: No focal deficit present.     Mental Status: She is alert and oriented to person, place, and time. Mental status is at baseline.  Psychiatric:        Mood and Affect: Mood normal.        Behavior: Behavior  normal.        Thought Content: Thought content normal.        Judgment: Judgment normal.    Results for orders placed or performed in visit on 05/23/20  TSH  Result Value Ref Range   TSH 0.930 0.450 - 4.500 uIU/mL  T3, free  Result Value Ref Range   T3, Free 2.9 2.0 - 4.4 pg/mL  T4  Result Value Ref Range   T4, Total 9.9 4.5 - 12.0 ug/dL  Hepatitis C antibody  Result Value Ref Range   Hep C Virus Ab <0.1 0.0 - 0.9 s/co ratio  HIV antibody (with reflex)  Result Value Ref Range   HIV Screen 4th Generation wRfx Non Reactive Non Reactive  Basic metabolic panel  Result Value Ref Range   Glucose 83 65 - 99 mg/dL   BUN 12 6 - 24 mg/dL   Creatinine, Ser 0.79 0.57 - 1.00 mg/dL   GFR calc non Af Amer 86 >59 mL/min/1.73   GFR calc Af Amer 100 >59 mL/min/1.73   BUN/Creatinine Ratio 15 9 - 23   Sodium 142 134 - 144 mmol/L   Potassium 3.6 3.5 - 5.2 mmol/L   Chloride 104 96 - 106 mmol/L   CO2 21 20 - 29 mmol/L   Calcium 9.4 8.7 - 10.2 mg/dL      Assessment & Plan:   Problem List Items Addressed This Visit       Cardiovascular and Mediastinum   Essential hypertension - Primary    Chronic.  Uncontrolled.  Continue with current medication regimen.  Refill sent of Amlodipine.  Recommend checking blood pressures at home.  Labs ordered today.  Return to clinic in 1 months for reevaluation.  Call sooner if concerns arise.        Relevant Medications   amLODipine (NORVASC) 5 MG tablet   Other Relevant Orders   Comp Met (CMET)     Endocrine   Hyperthyroidism    Patient has been out of medication for about a month.  Has not followed up with Endocrinology since 2020. Discussed with patient and her son the importance of taking medication and keeping up with follow ups.  Information given for patient to make an appointment with Dr. Gabriel Carina.  Labs ordered today. Methimazole refilled until she can get into Endocrinology.      Relevant Medications   methimazole (TAPAZOLE) 10 MG tablet    Other Relevant Orders   TSH   T4, free   Ambulatory referral to Endocrinology     Follow up plan: Return in about 1 month (around  07/21/2021) for BP Check.

## 2021-06-20 ENCOUNTER — Other Ambulatory Visit: Payer: Self-pay

## 2021-06-20 ENCOUNTER — Encounter: Payer: Self-pay | Admitting: Nurse Practitioner

## 2021-06-20 ENCOUNTER — Ambulatory Visit: Payer: Medicaid Other | Admitting: Nurse Practitioner

## 2021-06-20 VITALS — BP 146/85 | HR 87 | Temp 98.0°F | Ht 63.0 in | Wt 143.0 lb

## 2021-06-20 DIAGNOSIS — I10 Essential (primary) hypertension: Secondary | ICD-10-CM | POA: Diagnosis not present

## 2021-06-20 DIAGNOSIS — E0500 Thyrotoxicosis with diffuse goiter without thyrotoxic crisis or storm: Secondary | ICD-10-CM | POA: Insufficient documentation

## 2021-06-20 DIAGNOSIS — E0590 Thyrotoxicosis, unspecified without thyrotoxic crisis or storm: Secondary | ICD-10-CM | POA: Diagnosis not present

## 2021-06-20 MED ORDER — AMLODIPINE BESYLATE 5 MG PO TABS: 5.0000 mg | ORAL_TABLET | Freq: Every day | ORAL | 1 refills | Status: DC

## 2021-06-20 MED ORDER — TRIAMCINOLONE ACETONIDE 0.025 % EX OINT: 1.0000 "application " | TOPICAL_OINTMENT | Freq: Two times a day (BID) | CUTANEOUS | 1 refills | Status: DC

## 2021-06-20 MED ORDER — METHIMAZOLE 10 MG PO TABS: 10.0000 mg | ORAL_TABLET | Freq: Every day | ORAL | 1 refills | Status: DC

## 2021-06-20 NOTE — Assessment & Plan Note (Signed)
Patient has been out of medication for about a month.  Has not followed up with Endocrinology since 2020. Discussed with patient and her son the importance of taking medication and keeping up with follow ups.  Information given for patient to make an appointment with Dr. Gabriel Carina.  Labs ordered today. Methimazole refilled until she can get into Endocrinology.

## 2021-06-20 NOTE — Patient Instructions (Addendum)
[phone] Dr. [facility] [phone] or [phone]) [phone]

## 2021-06-20 NOTE — Assessment & Plan Note (Signed)
Chronic.  Uncontrolled.  Continue with current medication regimen.  Refill sent of Amlodipine.  Recommend checking blood pressures at home.  Labs ordered today.  Return to clinic in 1 months for reevaluation.  Call sooner if concerns arise.

## 2021-06-21 LAB — COMPREHENSIVE METABOLIC PANEL
ALT: 32 IU/L (ref 0–32)
AST: 23 IU/L (ref 0–40)
Albumin/Globulin Ratio: 1.7 (ref 1.2–2.2)
Albumin: 4.5 g/dL (ref 3.8–4.9)
Alkaline Phosphatase: 143 IU/L — ABNORMAL HIGH (ref 44–121)
BUN/Creatinine Ratio: 26 — ABNORMAL HIGH (ref 9–23)
BUN: 22 mg/dL (ref 6–24)
Bilirubin Total: 0.3 mg/dL (ref 0.0–1.2)
CO2: 23 mmol/L (ref 20–29)
Calcium: 9.3 mg/dL (ref 8.7–10.2)
Chloride: 105 mmol/L (ref 96–106)
Creatinine, Ser: 0.85 mg/dL (ref 0.57–1.00)
Globulin, Total: 2.6 g/dL (ref 1.5–4.5)
Glucose: 100 mg/dL — ABNORMAL HIGH (ref 70–99)
Potassium: 4.2 mmol/L (ref 3.5–5.2)
Sodium: 142 mmol/L (ref 134–144)
Total Protein: 7.1 g/dL (ref 6.0–8.5)
eGFR: 82 mL/min/{1.73_m2} (ref >59)

## 2021-06-21 LAB — TSH: TSH: 1.45 u[IU]/mL (ref 0.450–4.500)

## 2021-06-21 LAB — T4, FREE: Free T4: 1.34 ng/dL (ref 0.82–1.77)

## 2021-06-21 NOTE — Progress Notes (Signed)
Please let patient's son know that her thyroid hormone is normal.  She does not need to take the Methimazole at this time.  We need to keep an eye on her thyroid labs this will mean that she will follow up with me every 3 months over the next year.  Keep her appointment for one month and we will recheck the labs again.

## 2021-06-27 ENCOUNTER — Ambulatory Visit: Payer: Self-pay | Admitting: *Deleted

## 2021-06-27 ENCOUNTER — Other Ambulatory Visit: Payer: Self-pay

## 2021-06-27 ENCOUNTER — Emergency Department
Admission: EM | Admit: 2021-06-27 | Discharge: 2021-06-27 | Discharge status: Against Medical Advice | Payer: Medicaid Other | Attending: Emergency Medicine | Admitting: Emergency Medicine

## 2021-06-27 ENCOUNTER — Encounter: Payer: Self-pay | Admitting: Emergency Medicine

## 2021-06-27 ENCOUNTER — Emergency Department: Payer: Medicaid Other

## 2021-06-27 DIAGNOSIS — R079 Chest pain, unspecified: Secondary | ICD-10-CM | POA: Insufficient documentation

## 2021-06-27 DIAGNOSIS — M549 Dorsalgia, unspecified: Secondary | ICD-10-CM | POA: Diagnosis not present

## 2021-06-27 DIAGNOSIS — Z20822 Contact with and (suspected) exposure to covid-19: Secondary | ICD-10-CM | POA: Diagnosis not present

## 2021-06-27 DIAGNOSIS — R0600 Dyspnea, unspecified: Secondary | ICD-10-CM | POA: Diagnosis not present

## 2021-06-27 LAB — BASIC METABOLIC PANEL
Anion gap: 6 (ref 5–15)
BUN: 19 mg/dL (ref 6–20)
CO2: 27 mmol/L (ref 22–32)
Calcium: 8.9 mg/dL (ref 8.9–10.3)
Chloride: 104 mmol/L (ref 98–111)
Creatinine, Ser: 0.79 mg/dL (ref 0.44–1.00)
GFR, Estimated: >60 mL/min (ref >60)
Glucose, Bld: 99 mg/dL (ref 70–99)
Potassium: 3.6 mmol/L (ref 3.5–5.1)
Sodium: 137 mmol/L (ref 135–145)

## 2021-06-27 LAB — CBC
HCT: 42.3 % (ref 36.0–46.0)
Hemoglobin: 13.8 g/dL (ref 12.0–15.0)
MCH: 28.8 pg (ref 26.0–34.0)
MCHC: 32.6 g/dL (ref 30.0–36.0)
MCV: 88.3 fL (ref 80.0–100.0)
Platelets: 320 10*3/uL (ref 150–400)
RBC: 4.79 MIL/uL (ref 3.87–5.11)
RDW: 13.7 % (ref 11.5–15.5)
WBC: 9 10*3/uL (ref 4.0–10.5)
nRBC: 0 % (ref 0.0–0.2)

## 2021-06-27 LAB — TROPONIN I (HIGH SENSITIVITY)
Troponin I (High Sensitivity): 3 ng/L (ref <18)
Troponin I (High Sensitivity): <2 ng/L (ref <18)

## 2021-06-27 LAB — RESP PANEL BY RT-PCR (FLU A&B, COVID) ARPGX2
Influenza A by PCR: NEGATIVE
Influenza B by PCR: NEGATIVE
SARS Coronavirus 2 by RT PCR: NEGATIVE

## 2021-06-27 NOTE — Telephone Encounter (Signed)
°  Chief Complaint: Unable to sleep Symptoms: all over back discomfort. SOB last night, none today. Frequency: Began yesterday Pertinent Negatives: Patient denies  Disposition: [] ED /[] Urgent Care (no appt availability in office) / [x] Appointment(In office/virtual)/ []  [name]/ [] Home Care/ [] Refused Recommended Disposition  Additional Notes: Son will be bringing the patient. Stated her main problem is not being able to sleep. Seen in the ED  last night-all vitals wnl but left before seeing physician per son, [name] Also, Covid negative during that visit. Appointment with [name] for 06/28/21 okay per [name].   Answer Assessment - Initial Assessment Questions 1. RESPIRATORY STATUS: "Describe your breathing?" (e.g., wheezing, shortness of breath, unable to speak, severe coughing)      SOB 2. ONSET: "When did this breathing problem begin?"      Started last night 3. PATTERN "Does the difficult breathing come and go, or has it been constant since it started?"      Comes and goes 4. SEVERITY: "How bad is your breathing?" (e.g., mild, moderate, severe)    - MILD: No SOB at rest, mild SOB with walking, speaks normally in sentences, can lie down, no retractions, pulse < 100.    - MODERATE: SOB at rest, SOB with minimal exertion and prefers to sit, cannot lie down flat, speaks in phrases, mild retractions, audible wheezing, pulse 100-120.    - SEVERE: Very SOB at rest, speaks in single words, struggling to breathe, sitting hunched forward, retractions, pulse > 120       5. RECURRENT SYMPTOM: "Have you had difficulty breathing before?" If Yes, ask: "When was the last time?" and "What happened that time?"      no 6. CARDIAC HISTORY: "Do you have any history of heart disease?" (e.g., heart attack, angina, bypass surgery, angioplasty)       7. LUNG HISTORY: "Do you have any history of lung disease?"  (e.g., pulmonary embolus, asthma, emphysema)      8. CAUSE: "What do you think is  causing the breathing problem?"      unsure 9. OTHER SYMPTOMS: "Do you have any other symptoms? (e.g., dizziness, runny nose, cough, chest pain, fever)     unsure 10. O2 SATURATION MONITOR:  "Do you use an oxygen saturation monitor (pulse oximeter) at home?" If Yes, "What is your reading (oxygen level) today?" "What is your usual oxygen saturation reading?" (e.g., 95%)       na 11. PREGNANCY: "Is there any chance you are pregnant?" "When was your last menstrual period?"        12. TRAVEL: "Have you traveled out of the country in the last month?" (e.g., travel history, exposures)  Protocols used: Breathing Difficulty-A-AH

## 2021-06-27 NOTE — ED Triage Notes (Signed)
Pt arrived via POV with family reports chest pain and back pain, pt also c/o difficulty breathing through nose. Pt reports sxs began today.   Pt states sxs are interfering with her sleep.  No known sick contacts, denies fevers.   Pt c/o generalized chest pain.

## 2021-06-27 NOTE — Progress Notes (Signed)
Acute Office Visit  Subjective:    Patient ID: Mandy Alexander, female    DOB: 03/03/1966, 55 y.o.   MRN: [id number]  Chief Complaint  Patient presents with   Shortness of Breath    Patient states the back pain has gone away since she took Ibuprofen. Patient states she is having shortness of breath at times and is having trouble sleeping from time to time from it. Patient states it feels like the connection between her nasal down to her lungs there is a tightness. Patient states she opens the window to get air and does not know if the air is doing something or what is going on.     HPI Patient is in today for intermittent shortness of breath and chest pressure since yesterday. Her back was hurting yesterday, but this was relieved with ibuprofen that she took yesterday.   SHORTNESS OF BREATH  Duration: yesterday Onset: sudden Description of breathing discomfort: feels like chest is tight and she can't get a deep breath Severity: moderate Episode duration: comes and goes, worse when laying down Frequency: comes and goes, worse when laying down Related to exertion: no Cough: no Chest tightness: yes Wheezing: no Fevers: no Chest pain:  pressure Palpitations: no  Nausea: no Diaphoresis: no Deconditioning: no Status: fluctuating Aggravating factors: laying down, exertion Alleviating factors: going outside Treatments attempted: ibuprofen   Past Medical History:  Diagnosis Date   Hypertension     Past Surgical History:  Procedure Laterality Date   ABDOMINAL HYSTERECTOMY     NOSE SURGERY     tubes surgery     UTERINE FIBROID SURGERY  12/2020    Family History  Problem Relation Age of Onset   Drug abuse Father    Thyroid disease Sister    Cancer Maternal Grandmother     Social History   Socioeconomic History   Marital status: Single    Spouse name: Not on file   Number of children: Not on file   Years of education: Not on file   Highest education level: Not on  file  Occupational History   Not on file  Tobacco Use   Smoking status: Never   Smokeless tobacco: Never  Vaping Use   Vaping Use: Never used  Substance and Sexual Activity   Alcohol use: Yes    Comment: 1 glass of wine once a month socially   Drug use: Never   Sexual activity: Not Currently  Other Topics Concern   Not on file  Social History Narrative   Not on file   Social Determinants of Health   Financial Resource Strain: Not on file  Food Insecurity: Not on file  Transportation Needs: Not on file  Physical Activity: Not on file  Stress: Not on file  Social Connections: Not on file  Intimate Partner Violence: Not on file    Outpatient Medications Prior to Visit  Medication Sig Dispense Refill   amLODipine (NORVASC) 5 MG tablet Take 1 tablet (5 mg total) by mouth daily. 90 tablet 1   methimazole (TAPAZOLE) 10 MG tablet Take 1 tablet (10 mg total) by mouth daily. 90 tablet 1   triamcinolone (KENALOG) 0.025 % ointment Apply 1 application topically 2 (two) times daily. 30 g 1   No facility-administered medications prior to visit.    Allergies  Allergen Reactions   Other Rash    Nuts and bananas. Causes upset stomach    Review of Systems  Constitutional:  Positive for fatigue. Negative for fever.  HENT: Negative.    Eyes: Negative.   Respiratory:  Positive for chest tightness and shortness of breath (intermittent). Negative for cough.   Cardiovascular:  Positive for chest pain (intermittent pressure).  Gastrointestinal: Negative.   Genitourinary: Negative.   Musculoskeletal:  Positive for back pain (resolved with ibuprofen).  Skin: Negative.   Neurological: Negative.       Objective:    Physical Exam Vitals and nursing note reviewed.  Constitutional:      General: She is not in acute distress.    Appearance: Normal appearance.  HENT:     Head: Normocephalic.  Eyes:     Conjunctiva/sclera: Conjunctivae normal.  Cardiovascular:     Rate and Rhythm:  Normal rate and regular rhythm.     Pulses: Normal pulses.     Heart sounds: Normal heart sounds.  Pulmonary:     Effort: Pulmonary effort is normal.     Breath sounds: Normal breath sounds.  Musculoskeletal:        General: Tenderness (chest on palpation) present.     Cervical back: Normal range of motion.  Skin:    General: Skin is warm.  Neurological:     General: No focal deficit present.     Mental Status: She is alert and oriented to person, place, and time.  Psychiatric:        Mood and Affect: Mood normal.        Behavior: Behavior normal.        Thought Content: Thought content normal.        Judgment: Judgment normal.    BP 134/84    Pulse 94    Temp 98.3 F (36.8 C) (Oral)    Wt 146 lb (66.2 kg)    SpO2 97%    BMI 25.86 kg/m  Wt Readings from Last 3 Encounters:  06/28/21 146 lb (66.2 kg)  06/27/21 143 lb (64.9 kg)  06/20/21 143 lb (64.9 kg)    Health Maintenance Due  Topic Date Due   TETANUS/TDAP  Never done   PAP SMEAR-Modifier  Never done   COLONOSCOPY (Pts 45-80yr Insurance coverage will need to be confirmed)  Never done   MAMMOGRAM  Never done   Zoster Vaccines- Shingrix (1 of 2) Never done   COVID-19 Vaccine (4 - Booster for Pfizer series) 04/21/2020   INFLUENZA VACCINE  01/29/2021    There are no preventive care reminders to display for this patient.   Lab Results  Component Value Date   TSH 1.450 06/20/2021   Lab Results  Component Value Date   WBC 9.0 06/27/2021   HGB 13.8 06/27/2021   HCT 42.3 06/27/2021   MCV 88.3 06/27/2021   PLT 320 06/27/2021   Lab Results  Component Value Date   NA 137 06/27/2021   K 3.6 06/27/2021   CO2 27 06/27/2021   GLUCOSE 99 06/27/2021   BUN 19 06/27/2021   CREATININE 0.79 06/27/2021   BILITOT 0.3 06/20/2021   ALKPHOS 143 (H) 06/20/2021   AST 23 06/20/2021   ALT 32 06/20/2021   PROT 7.1 06/20/2021   ALBUMIN 4.5 06/20/2021   CALCIUM 8.9 06/27/2021   ANIONGAP 6 06/27/2021   EGFR 82 06/20/2021   No  results found for: CHOL No results found for: HDL No results found for: LDLCALC No results found for: TRIG No results found for: CHOLHDL No results found for: HGBA1C     Assessment & Plan:   Problem List Items Addressed This Visit  Other   Dyspnea on exertion - Primary    Started yesterday, went to ER but left without being seen by a provider. Troponin negative x2. Chest x-ray negative. BMP and CBC normal. EKG in office today shows normal sinus rhythm with heart rate 94. Possible left atrial enlargement and peaked P waves. She does a lot of sitting at home. With the EKG changes and shortness of breath with negative work up in ER, will order CT angio to rule out PE. With shortness of breath and chest pain on palpation will send in prednisone taper. Will also refer to cardiology. Follow up in 1 week.       Relevant Orders   EKG 12-Lead (Completed)   CT Angio Chest W/Cm &/Or Wo Cm   Ambulatory referral to Cardiology   Chest pressure    Started yesterday, went to ER but left without being seen by a provider. Troponin negative x2. Chest x-ray negative. BMP and CBC normal. EKG in office today shows normal sinus rhythm with heart rate 94. Possible left atrial enlargement and peaked P waves. She does a lot of sitting at home. With the EKG changes and shortness of breath with negative work up in ER, will order CT angio to rule out PE. With shortness of breath and chest pain on palpation will send in prednisone taper. Will also refer to cardiology. Follow up in 1 week.       Relevant Orders   EKG 12-Lead (Completed)   CT Angio Chest W/Cm &/Or Wo Cm   Ambulatory referral to Cardiology   Other Visit Diagnoses     Encounter for screening mammogram for malignant neoplasm of breast       Mammogram ordered   Relevant Orders   MM 3D SCREEN BREAST BILATERAL        Meds ordered this encounter  Medications   predniSONE (DELTASONE) 10 MG tablet    Sig: Take 6 tablets today, then 5  tablets tomorrow, then decrease by 1 tablet every day until gone    Dispense:  21 tablet    Refill:  0   albuterol (VENTOLIN HFA) 108 (90 Base) MCG/ACT inhaler    Sig: Inhale 2 puffs into the lungs every 6 (six) hours as needed for wheezing or shortness of breath.    Dispense:  8 g    Refill:  0   A total of 35 minutes were spent on this encounter today. When total time is documented, this includes both the face-to-face and non-face-to-face time personally spent before, during and after the visit on the date of the encounter.    Charyl Dancer, NP

## 2021-06-28 ENCOUNTER — Ambulatory Visit (INDEPENDENT_AMBULATORY_CARE_PROVIDER_SITE_OTHER): Payer: Medicaid Other | Admitting: Nurse Practitioner

## 2021-06-28 ENCOUNTER — Telehealth: Payer: Self-pay

## 2021-06-28 ENCOUNTER — Encounter: Payer: Self-pay | Admitting: Nurse Practitioner

## 2021-06-28 ENCOUNTER — Ambulatory Visit
Admission: RE | Admit: 2021-06-28 | Discharge: 2021-06-28 | Disposition: A | Discharge status: Home / Self Care | Payer: Medicaid Other | Source: Ambulatory Visit | Attending: Nurse Practitioner | Admitting: Nurse Practitioner

## 2021-06-28 VITALS — BP 134/84 | HR 94 | Temp 98.3°F | Wt 146.0 lb

## 2021-06-28 DIAGNOSIS — R0609 Other forms of dyspnea: Secondary | ICD-10-CM | POA: Insufficient documentation

## 2021-06-28 DIAGNOSIS — R0789 Other chest pain: Secondary | ICD-10-CM | POA: Insufficient documentation

## 2021-06-28 DIAGNOSIS — Z1231 Encounter for screening mammogram for malignant neoplasm of breast: Secondary | ICD-10-CM | POA: Diagnosis not present

## 2021-06-28 MED ORDER — PREDNISONE 10 MG PO TABS: ORAL_TABLET | ORAL | 0 refills | Status: DC

## 2021-06-28 MED ORDER — IOHEXOL 350 MG/ML SOLN
75.0000 mL | Freq: Once | INTRAVENOUS | Status: AC | PRN
  Administered 2021-06-28: 17:00:00 75 mL via INTRAVENOUS

## 2021-06-28 MED ORDER — ALBUTEROL SULFATE HFA 108 (90 BASE) MCG/ACT IN AERS: 2.0000 | INHALATION_SPRAY | Freq: Four times a day (QID) | RESPIRATORY_TRACT | 0 refills | Status: DC | PRN

## 2021-06-28 NOTE — Patient Instructions (Addendum)
CT scan of lungs today, head over to the medical mall at [facility] now  Start prednisone daily, 6 tablets today, 5 tomorrow, then decrease by 1 tablet every day until gone. Take with food  Start albuterol inhaler 2 puffs as needed every 6 hours for shortness of breath  You will receive a call from cardiology to schedule an appointment   Call to schedule your mammogram:   [facility] [place]: [place], [place], [place] [address]  Phone: [phone]

## 2021-06-28 NOTE — Assessment & Plan Note (Signed)
Started yesterday, went to ER but left without being seen by a provider. Troponin negative x2. Chest x-ray negative. BMP and CBC normal. EKG in office today shows normal sinus rhythm with heart rate 94. Possible left atrial enlargement and peaked P waves. She does a lot of sitting at home. With the EKG changes and shortness of breath with negative work up in ER, will order CT angio to rule out PE. With shortness of breath and chest pain on palpation will send in prednisone taper. Will also refer to cardiology. Follow up in 1 week.

## 2021-06-28 NOTE — Progress Notes (Signed)
EKG interpreted by me on 06/28/21 showed normal sinus rhythm with a heart rate of 94. Possible left atrial enlargement with peaked P waves

## 2021-06-28 NOTE — Telephone Encounter (Signed)
Transition Care Management Follow-up Telephone Call Date of discharge and from where: 06/27/2021 from [facility] How have you been since you were released from the hospital? Spoke to pt son Printmaker) and he stated that pt is still having some SOB. Appt with PCP today. Pt is also complaining about back pain. Pt and son left [facility] yesterday. Eliott asked about EKG results. I informed that I was not able to interpret the results. I read the automated interpretation and informed that is not definitive due other factors that are involved with professional interpretation. Son stated understanding.  Any questions or concerns? No  Items Reviewed: Did the pt receive and understand the discharge instructions provided? Yes  Medications obtained and verified? Yes  Other? No  Any new allergies since your discharge? No  Dietary orders reviewed? No Do you have support at home? Yes   Functional Questionnaire: (I = Independent and D = Dependent) ADLs: I  Bathing/Dressing- I  Meal Prep- I  Eating- I  Maintaining continence- I  Transferring/Ambulation- I  Managing Meds- I   Follow up appointments reviewed:  PCP Hospital f/u appt confirmed? Yes  Scheduled to see Vance Peper, NP on 06/28/2021 @ 1:20pm. [facility] f/u appt confirmed? No   Are transportation arrangements needed? No  If their condition worsens, is the pt aware to call PCP or go to the Emergency Dept.? Yes Was the patient provided with contact information for the PCP's office or ED? Yes Was to pt encouraged to call back with questions or concerns? Yes

## 2021-07-10 ENCOUNTER — Ambulatory Visit (INDEPENDENT_AMBULATORY_CARE_PROVIDER_SITE_OTHER): Payer: Medicaid Other | Admitting: Nurse Practitioner

## 2021-07-10 ENCOUNTER — Other Ambulatory Visit: Payer: Self-pay

## 2021-07-10 ENCOUNTER — Encounter: Payer: Self-pay | Admitting: Nurse Practitioner

## 2021-07-10 VITALS — BP 137/87 | HR 85 | Temp 98.3°F | Wt 148.2 lb

## 2021-07-10 DIAGNOSIS — R0609 Other forms of dyspnea: Secondary | ICD-10-CM | POA: Diagnosis not present

## 2021-07-10 NOTE — Progress Notes (Signed)
Acute Office Visit  Subjective:    Patient ID: Mandy Alexander, female    DOB: [birth year], 56 y.o.   MRN: [id number]  Chief Complaint  Patient presents with   Breathing Problem    1 week f/up - pt states she is doing better since taking the Prednisone       Patient presents to clinic to follow up on shortness of breath.  She states that the prednisone helped her symptoms.  She is no longer having SOB, fatigue, or a cough.  Denies concerns in office today.     Past Medical History:  Diagnosis Date   Hypertension     Past Surgical History:  Procedure Laterality Date   ABDOMINAL HYSTERECTOMY     NOSE SURGERY     tubes surgery     UTERINE FIBROID SURGERY  12/2020    Family History  Problem Relation Age of Onset   Drug abuse Father    Thyroid disease Sister    Cancer Maternal Grandmother     Social History   Socioeconomic History   Marital status: Single    Spouse name: Not on file   Number of children: Not on file   Years of education: Not on file   Highest education level: Not on file  Occupational History   Not on file  Tobacco Use   Smoking status: Never   Smokeless tobacco: Never  Vaping Use   Vaping Use: Never used  Substance and Sexual Activity   Alcohol use: Yes    Comment: 1 glass of wine once a month socially   Drug use: Never   Sexual activity: Not Currently  Other Topics Concern   Not on file  Social History Narrative   Not on file   Social Determinants of Health   Financial Resource Strain: Not on file  Food Insecurity: Not on file  Transportation Needs: Not on file  Physical Activity: Not on file  Stress: Not on file  Social Connections: Not on file  Intimate Partner Violence: Not on file    Outpatient Medications Prior to Visit  Medication Sig Dispense Refill   amLODipine (NORVASC) 5 MG tablet Take 1 tablet (5 mg total) by mouth daily. 90 tablet 1   triamcinolone (KENALOG) 0.025 % ointment Apply 1 application topically 2 (two)  times daily. 30 g 1   albuterol (VENTOLIN HFA) 108 (90 Base) MCG/ACT inhaler Inhale 2 puffs into the lungs every 6 (six) hours as needed for wheezing or shortness of breath. (Patient not taking: Reported on 07/10/2021) 8 g 0   methimazole (TAPAZOLE) 10 MG tablet Take 1 tablet (10 mg total) by mouth daily. (Patient not taking: Reported on 07/10/2021) 90 tablet 1   predniSONE (DELTASONE) 10 MG tablet Take 6 tablets today, then 5 tablets tomorrow, then decrease by 1 tablet every day until gone 21 tablet 0   No facility-administered medications prior to visit.    Allergies  Allergen Reactions   Other Rash    Nuts and bananas. Causes upset stomach    Review of Systems  Constitutional:  Negative for fatigue.  Respiratory:  Negative for cough and shortness of breath.       Objective:    Physical Exam Vitals and nursing note reviewed.  Constitutional:      General: She is not in acute distress.    Appearance: Normal appearance.  HENT:     Head: Normocephalic.  Eyes:     Conjunctiva/sclera: Conjunctivae normal.  Cardiovascular:  Rate and Rhythm: Normal rate and regular rhythm.     Pulses: Normal pulses.     Heart sounds: Normal heart sounds.  Pulmonary:     Effort: Pulmonary effort is normal.     Breath sounds: Normal breath sounds.  Musculoskeletal:        General: Tenderness: chest on palpation.     Cervical back: Normal range of motion.  Skin:    General: Skin is warm.  Neurological:     General: No focal deficit present.     Mental Status: She is alert and oriented to person, place, and time.  Psychiatric:        Mood and Affect: Mood normal.        Behavior: Behavior normal.        Thought Content: Thought content normal.        Judgment: Judgment normal.    BP 137/87    Pulse 85    Temp 98.3 F (36.8 C) (Oral)    Wt 148 lb 3.2 oz (67.2 kg)    SpO2 97%    BMI 26.25 kg/m  Wt Readings from Last 3 Encounters:  07/10/21 148 lb 3.2 oz (67.2 kg)  06/28/21 146 lb (66.2  kg)  06/27/21 143 lb (64.9 kg)    Health Maintenance Due  Topic Date Due   TETANUS/TDAP  Never done   PAP SMEAR-Modifier  Never done   COLONOSCOPY (Pts 45-77yr Insurance coverage will need to be confirmed)  Never done   MAMMOGRAM  Never done   Zoster Vaccines- Shingrix (1 of 2) Never done   COVID-19 Vaccine (4 - Booster for Pfizer series) 04/21/2020    There are no preventive care reminders to display for this patient.   Lab Results  Component Value Date   TSH 1.450 06/20/2021   Lab Results  Component Value Date   WBC 9.0 06/27/2021   HGB 13.8 06/27/2021   HCT 42.3 06/27/2021   MCV 88.3 06/27/2021   PLT 320 06/27/2021   Lab Results  Component Value Date   NA 137 06/27/2021   K 3.6 06/27/2021   CO2 27 06/27/2021   GLUCOSE 99 06/27/2021   BUN 19 06/27/2021   CREATININE 0.79 06/27/2021   BILITOT 0.3 06/20/2021   ALKPHOS 143 (H) 06/20/2021   AST 23 06/20/2021   ALT 32 06/20/2021   PROT 7.1 06/20/2021   ALBUMIN 4.5 06/20/2021   CALCIUM 8.9 06/27/2021   ANIONGAP 6 06/27/2021   EGFR 82 06/20/2021   No results found for: CHOL No results found for: HDL No results found for: LDLCALC No results found for: TRIG No results found for: CHOLHDL No results found for: HGBA1C     Assessment & Plan:   Problem List Items Addressed This Visit       Other   Dyspnea on exertion - Primary    Improved since last visit. Has completed prednisone and feeling better.  Denies concerns at visit today. Follow up if symptoms worsen or fail to improve.       No orders of the defined types were placed in this encounter.     KJon Billings NP

## 2021-07-10 NOTE — Assessment & Plan Note (Signed)
Improved since last visit. Has completed prednisone and feeling better.  Denies concerns at visit today. Follow up if symptoms worsen or fail to improve.

## 2021-07-19 ENCOUNTER — Telehealth: Payer: Self-pay | Admitting: Nurse Practitioner

## 2021-07-19 DIAGNOSIS — Z01419 Encounter for gynecological examination (general) (routine) without abnormal findings: Secondary | ICD-10-CM

## 2021-07-19 NOTE — Telephone Encounter (Signed)
Patient son aware referral has been placed.

## 2021-07-19 NOTE — Telephone Encounter (Signed)
Copied from [place] [id number]. Topic: Referral - Request for Referral >> Jul 19, 2021  9:41 AM Oneta Rack wrote: [name] son would like PCP to send a referral to [facility], [address], [place], [place] [address], [phone] due to the practice not accepting New Patients but will schedule patient if a referral is sent from PCP. Patient is not having any concerns just needs a GYN.  Patient has new insurance and would like the following information updated:  [employer] Member ID # [id number] Group # [facility] Effective date 07/18/2021

## 2021-07-20 NOTE — Progress Notes (Signed)
BP 128/83    Pulse 83    Temp 97.8 F (36.6 C)    Wt 149 lb (67.6 kg)    SpO2 99%    BMI 26.39 kg/m    Subjective:    Patient ID: Mandy Alexander, female    DOB: Jun 18, 1966, 56 y.o.   MRN: [id number]  HPI: Mandy Alexander is a 56 y.o. female  Chief Complaint  Patient presents with   Hypertension   HYPERTENSION Hypertension status: controlled Satisfied with current treatment? no Duration of hypertension: years BP monitoring frequency:  not checking BP range:  BP medication side effects:  no Medication compliance: fair compliance Previous BP meds:amlodipine Aspirin: no Recurrent headaches: no Visual changes: no Palpitations: no Dyspnea: no Chest pain: no Lower extremity edema: no Dizzy/lightheaded: no    Relevant past medical, surgical, family and social history reviewed and updated as indicated. Interim medical history since our last visit reviewed. Allergies and medications reviewed and updated.  Review of Systems  Eyes:  Negative for visual disturbance.  Respiratory:  Negative for cough, chest tightness and shortness of breath.   Cardiovascular:  Negative for chest pain, palpitations and leg swelling.  Neurological:  Negative for dizziness and headaches.   Per HPI unless specifically indicated above     Objective:    BP 128/83    Pulse 83    Temp 97.8 F (36.6 C)    Wt 149 lb (67.6 kg)    SpO2 99%    BMI 26.39 kg/m   Wt Readings from Last 3 Encounters:  07/23/21 149 lb (67.6 kg)  07/10/21 148 lb 3.2 oz (67.2 kg)  06/28/21 146 lb (66.2 kg)    Physical Exam Vitals and nursing note reviewed.  Constitutional:      General: She is not in acute distress.    Appearance: Normal appearance. She is normal weight. She is not ill-appearing, toxic-appearing or diaphoretic.  HENT:     Head: Normocephalic.     Right Ear: External ear normal.     Left Ear: External ear normal.     Nose: Nose normal.     Mouth/Throat:     Mouth: Mucous membranes are moist.      Pharynx: Oropharynx is clear.  Eyes:     General:        Right eye: No discharge.        Left eye: No discharge.     Extraocular Movements: Extraocular movements intact.     Conjunctiva/sclera: Conjunctivae normal.     Pupils: Pupils are equal, round, and reactive to light.  Cardiovascular:     Rate and Rhythm: Normal rate and regular rhythm.     Heart sounds: No murmur heard. Pulmonary:     Effort: Pulmonary effort is normal. No respiratory distress.     Breath sounds: Normal breath sounds. No wheezing or rales.  Musculoskeletal:     Cervical back: Normal range of motion and neck supple.  Skin:    General: Skin is warm and dry.     Capillary Refill: Capillary refill takes less than 2 seconds.       Neurological:     General: No focal deficit present.     Mental Status: She is alert and oriented to person, place, and time. Mental status is at baseline.  Psychiatric:        Mood and Affect: Mood normal.        Behavior: Behavior normal.        Thought Content: Thought  content normal.        Judgment: Judgment normal.    Results for orders placed or performed during the hospital encounter of 06/27/21  Resp Panel by RT-PCR (Flu A&B, Covid) Nasopharyngeal Swab   Specimen: Nasopharyngeal Swab; Nasopharyngeal(NP) swabs in vial transport medium  Result Value Ref Range   SARS Coronavirus 2 by RT PCR NEGATIVE NEGATIVE   Influenza A by PCR NEGATIVE NEGATIVE   Influenza B by PCR NEGATIVE NEGATIVE  Basic metabolic panel  Result Value Ref Range   Sodium 137 135 - 145 mmol/L   Potassium 3.6 3.5 - 5.1 mmol/L   Chloride 104 98 - 111 mmol/L   CO2 27 22 - 32 mmol/L   Glucose, Bld 99 70 - 99 mg/dL   BUN 19 6 - 20 mg/dL   Creatinine, Ser 0.79 0.44 - 1.00 mg/dL   Calcium 8.9 8.9 - 10.3 mg/dL   GFR, Estimated >60 >60 mL/min   Anion gap 6 5 - 15  CBC  Result Value Ref Range   WBC 9.0 4.0 - 10.5 K/uL   RBC 4.79 3.87 - 5.11 MIL/uL   Hemoglobin 13.8 12.0 - 15.0 g/dL   HCT 42.3 36.0 -  46.0 %   MCV 88.3 80.0 - 100.0 fL   MCH 28.8 26.0 - 34.0 pg   MCHC 32.6 30.0 - 36.0 g/dL   RDW 13.7 11.5 - 15.5 %   Platelets 320 150 - 400 K/uL   nRBC 0.0 0.0 - 0.2 %  Troponin I (High Sensitivity)  Result Value Ref Range   Troponin I (High Sensitivity) 3 <18 ng/L  Troponin I (High Sensitivity)  Result Value Ref Range   Troponin I (High Sensitivity) <2 <18 ng/L      Assessment & Plan:   Problem List Items Addressed This Visit       Cardiovascular and Mediastinum   Essential hypertension - Primary    Chronic.  Controlled.  Continue with current medication regimen on Amlodipine 5mg  daily. Return to clinic in 3 months for reevaluation.  Call sooner if concerns arise.          Follow up plan: Return in about 3 months (around 10/21/2021) for BP Check and thyroid check.

## 2021-07-23 ENCOUNTER — Encounter: Payer: Self-pay | Admitting: Nurse Practitioner

## 2021-07-23 ENCOUNTER — Other Ambulatory Visit: Payer: Self-pay

## 2021-07-23 ENCOUNTER — Ambulatory Visit (INDEPENDENT_AMBULATORY_CARE_PROVIDER_SITE_OTHER): Payer: Medicaid Other | Admitting: Nurse Practitioner

## 2021-07-23 VITALS — BP 128/83 | HR 83 | Temp 97.8°F | Wt 149.0 lb

## 2021-07-23 DIAGNOSIS — I10 Essential (primary) hypertension: Secondary | ICD-10-CM

## 2021-07-23 NOTE — Patient Instructions (Signed)
Please call to schedule your mammogram and/or bone density: °[facility]  °Address: [address], , [place] [address]  °Phone: [phone] ° °

## 2021-07-23 NOTE — Assessment & Plan Note (Signed)
Chronic.  Controlled.  Continue with current medication regimen on Amlodipine 5mg  daily. Return to clinic in 3 months for reevaluation.  Call sooner if concerns arise.

## 2021-08-13 NOTE — Progress Notes (Addendum)
PCP: Jon Billings, NP   Chief Complaint  Patient presents with   Gynecologic Exam    Vaginal bleeding since early Feb, no pain. Feels some bulging in vag area. External vag pimple.   PT REQUIRES SIGN LANGUAGE SERVICES  HPI:      Ms. Mandy Alexander is a 56 y.o. No obstetric history on file. whose LMP was No LMP recorded. Patient is postmenopausal., presents today for her NP annual examination, referred by PCP.  Her menses  were irregular prior to 6/22, lasting 5-6 days, heavy with clots, mild dysmen. Was noted to have 2 fibroids and had surgery to remove them (in [place]). Pt unsure what was done but doesn't think she had full hyst (as was in her Epic history). No bleeding again till 08/02/21. Now having intermittent blood with wiping, no dysmen/pain. Has noted feeling a little bulge vaginally recently.   Sex activity: single partner, contraception - tubal ligation. She does not have vaginal dryness, pain or bleeding. Has noted a small pimple type mass LT labia   Last Pap: not recent; no hx of abn paps  Last mammogram:  Has appt 2/23 There is no FH of breast cancer. There is no FH of ovarian cancer. The patient does do self-breast exams.  Colonoscopy: ~3 yrs ago per pt; unsure when due again  Tobacco use: The patient denies current or previous tobacco use. Alcohol use: social drinker Exercise: moderately active  She does get adequate calcium but not Vitamin D in her diet.  Labs with PCP.  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   Hearing impaired 08/14/2021   Dyspnea on exertion 06/28/2021   Chest pressure 06/28/2021   Graves disease 06/20/2021   Loose stools 05/23/2020   Hyperthyroidism 02/15/2019   Essential hypertension 01/07/2019    Past Surgical History:  Procedure Laterality Date   NOSE SURGERY     TUBAL LIGATION     UTERINE FIBROID SURGERY  12/2020     Family History  Problem Relation Age of Onset   Drug abuse Father    Thyroid disease Sister    Cancer Maternal Grandmother     Social History   Socioeconomic History   Marital status: Single    Spouse name: Not on file   Number of children: Not on file   Years of education: Not on file   Highest education level: Not on file  Occupational History   Not on file  Tobacco Use   Smoking status: Never   Smokeless tobacco: Never  Vaping Use   Vaping Use: Never used  Substance and Sexual Activity   Alcohol use: Yes    Comment: 1 glass of wine once a month socially   Drug use: Never   Sexual activity: Yes    Birth control/protection: Post-menopausal  Other Topics Concern   Not on file  Social History Narrative   Not on file   Social Determinants of Health   Financial Resource Strain: Not on file  Food Insecurity: Not on file  Transportation Needs: Not on file  Physical Activity: Not on file  Stress: Not on file  Social Connections: Not on file  Intimate Partner Violence: Not on file     Current Outpatient Medications:    amLODipine (NORVASC) 5 MG tablet, Take 1 tablet (5 mg total) by mouth daily., Disp: 90 tablet, Rfl: 1   triamcinolone (KENALOG) 0.025 % ointment, Apply 1  application topically 2 (two) times daily., Disp: 30 g, Rfl: 1   albuterol (VENTOLIN HFA) 108 (90 Base) MCG/ACT inhaler, Inhale 2 puffs into the lungs every 6 (six) hours as needed for wheezing or shortness of breath. (Patient not taking: Reported on 07/10/2021), Disp: 8 g, Rfl: 0     ROS:  Review of Systems  Constitutional:  Negative for fatigue, fever and unexpected weight change.  Respiratory:  Negative for cough, shortness of breath and wheezing.   Cardiovascular:  Negative for chest pain, palpitations and leg swelling.  Gastrointestinal:  Negative for blood in stool, constipation, diarrhea, nausea and vomiting.  Endocrine: Negative for cold intolerance, heat intolerance  and polyuria.  Genitourinary:  Positive for vaginal bleeding. Negative for dyspareunia, dysuria, flank pain, frequency, genital sores, hematuria, menstrual problem, pelvic pain, urgency, vaginal discharge and vaginal pain.  Musculoskeletal:  Negative for back pain, joint swelling and myalgias.  Skin:  Negative for rash.  Neurological:  Negative for dizziness, syncope, light-headedness, numbness and headaches.  Hematological:  Negative for adenopathy.  Psychiatric/Behavioral:  Negative for agitation, confusion, sleep disturbance and suicidal ideas. The patient is not nervous/anxious.   BREAST: No symptoms    Objective: BP 130/80    Ht 5\' 3"  (1.6 m)    Wt 152 lb (68.9 kg)    BMI 26.93 kg/m    Physical Exam Constitutional:      Appearance: She is well-developed.  Genitourinary:     Vulva normal.     Genitourinary Comments: SUPERIOR PART OF CX EXTREMELY LONGATED; CX OS HARD TO IDENTIFY BUT WAS NORMAL ON VISUAL EXAM; CX VERY TENDER ON EXAM TO TOUCH AND MOVEMENT  (EXAMINED WITH DR. HARRIS DUE TO ABNORMALITY)     Right Labia: No rash, tenderness or lesions.    Left Labia: lesions.     Left Labia: No tenderness or rash.       No vaginal discharge, erythema or tenderness.      Right Adnexa: not tender and no mass present.    Left Adnexa: not tender and no mass present.    Cervical elongation present.     No cervical friability or polyp.     Uterus is not enlarged or tender.  Breasts:    Right: No mass, nipple discharge, skin change or tenderness.     Left: No mass, nipple discharge, skin change or tenderness.  Neck:     Thyroid: No thyromegaly.  Cardiovascular:     Rate and Rhythm: Normal rate and regular rhythm.     Heart sounds: Normal heart sounds. No murmur heard. Pulmonary:     Effort: Pulmonary effort is normal.     Breath sounds: Normal breath sounds.  Abdominal:     Palpations: Abdomen is soft.     Tenderness: There is no abdominal tenderness. There is no guarding or  rebound.  Musculoskeletal:        General: Normal range of motion.     Cervical back: Normal range of motion.  Lymphadenopathy:     Cervical: No cervical adenopathy.  Neurological:     General: No focal deficit present.     Mental Status: She is alert and oriented to person, place, and time.     Cranial Nerves: No cranial nerve deficit.  Skin:    General: Skin is warm and dry.  Psychiatric:        Mood and Affect: Mood normal.        Behavior: Behavior normal.  Thought Content: Thought content normal.        Judgment: Judgment normal.  Vitals reviewed.    Assessment/Plan:  Encounter for annual routine gynecological examination  Cervical cancer screening - Plan: Cytology - PAP  Screening for HPV (human papillomavirus) - Plan: Cytology - PAP  Encounter for screening mammogram for malignant neoplasm of breast; pt has mammo appt  PMB (postmenopausal bleeding) - Plan: US PELVIC COMPLETE WITH TRANSVAGINAL; question PMB vs perimenopausal bleeding. Hx is vague. Pt's cx is abnormal and tender. Check pap, GYN u/s, will f/u with results. If u/s indeterminate, may need MRI.  Leiomyoma - Plan: US PELVIC COMPLETE WITH TRANSVAGINAL; removed per pt hx, check GYN u/s for f/u.   Cervix abnormality--elongated. Check pap and GYN u/s.   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 Reassurance.          GYN counsel breast self exam, mammography screening, menopause, adequate intake of calcium and vitamin D, diet and exercise  PT NEEDS SIGN LANGUAGE SERVICES    F/U  Return in about 1 year (around 08/14/2022).  Bashir Marchetti B. Cecil Bixby, PA-C 08/14/2021 11:24 AM

## 2021-08-14 ENCOUNTER — Other Ambulatory Visit (HOSPITAL_COMMUNITY)
Admission: RE | Admit: 2021-08-14 | Discharge: 2021-08-14 | Disposition: A | Discharge status: Home / Self Care | Payer: Medicaid Other | Source: Ambulatory Visit | Attending: Obstetrics and Gynecology | Admitting: Obstetrics and Gynecology

## 2021-08-14 ENCOUNTER — Encounter: Payer: Self-pay | Admitting: Obstetrics and Gynecology

## 2021-08-14 ENCOUNTER — Ambulatory Visit (INDEPENDENT_AMBULATORY_CARE_PROVIDER_SITE_OTHER): Payer: Medicaid Other | Admitting: Obstetrics and Gynecology

## 2021-08-14 ENCOUNTER — Other Ambulatory Visit: Payer: Self-pay

## 2021-08-14 VITALS — BP 130/80 | Ht 63.0 in | Wt 152.0 lb

## 2021-08-14 DIAGNOSIS — D219 Benign neoplasm of connective and other soft tissue, unspecified: Secondary | ICD-10-CM

## 2021-08-14 DIAGNOSIS — Z1151 Encounter for screening for human papillomavirus (HPV): Secondary | ICD-10-CM

## 2021-08-14 DIAGNOSIS — Z124 Encounter for screening for malignant neoplasm of cervix: Secondary | ICD-10-CM | POA: Insufficient documentation

## 2021-08-14 DIAGNOSIS — Z0000 Encounter for general adult medical examination without abnormal findings: Secondary | ICD-10-CM

## 2021-08-14 DIAGNOSIS — L738 Other specified follicular disorders: Secondary | ICD-10-CM | POA: Diagnosis not present

## 2021-08-14 DIAGNOSIS — Z780 Asymptomatic menopausal state: Secondary | ICD-10-CM | POA: Diagnosis not present

## 2021-08-14 DIAGNOSIS — N950 Postmenopausal bleeding: Secondary | ICD-10-CM | POA: Diagnosis not present

## 2021-08-14 DIAGNOSIS — Z01419 Encounter for gynecological examination (general) (routine) without abnormal findings: Secondary | ICD-10-CM

## 2021-08-14 DIAGNOSIS — Z1231 Encounter for screening mammogram for malignant neoplasm of breast: Secondary | ICD-10-CM

## 2021-08-14 DIAGNOSIS — H9190 Unspecified hearing loss, unspecified ear: Secondary | ICD-10-CM | POA: Insufficient documentation

## 2021-08-14 DIAGNOSIS — N889 Noninflammatory disorder of cervix uteri, unspecified: Secondary | ICD-10-CM

## 2021-08-14 DIAGNOSIS — H9193 Unspecified hearing loss, bilateral: Secondary | ICD-10-CM

## 2021-08-14 DIAGNOSIS — Z1211 Encounter for screening for malignant neoplasm of colon: Secondary | ICD-10-CM

## 2021-08-14 NOTE — Patient Instructions (Addendum)
I value your feedback and you entrusting us with your care. If you get a Mandy Alexander patient survey, I would appreciate you taking the time to let us know about your experience today. Thank you! ? ? ?

## 2021-08-15 DIAGNOSIS — H5213 Myopia, bilateral: Secondary | ICD-10-CM | POA: Diagnosis not present

## 2021-08-16 LAB — CYTOLOGY - PAP
Comment: NEGATIVE
Diagnosis: UNDETERMINED — AB
High risk HPV: NEGATIVE

## 2021-08-20 ENCOUNTER — Telehealth: Payer: Self-pay

## 2021-08-20 NOTE — Telephone Encounter (Signed)
Pt's son aware, will let mom know. (On DPR)

## 2021-08-20 NOTE — Telephone Encounter (Signed)
Patient is calling for her results. Please advise [phone]

## 2021-08-20 NOTE — Telephone Encounter (Signed)
Pls let pt know pap smear was normal and due for repeat in 3 yrs. She is deaf and needs relay line.

## 2021-08-21 ENCOUNTER — Other Ambulatory Visit: Payer: Self-pay

## 2021-08-21 ENCOUNTER — Ambulatory Visit
Admission: RE | Admit: 2021-08-21 | Discharge: 2021-08-21 | Disposition: A | Discharge status: Home / Self Care | Payer: Medicaid Other | Source: Ambulatory Visit | Attending: Obstetrics and Gynecology | Admitting: Obstetrics and Gynecology

## 2021-08-21 DIAGNOSIS — D252 Subserosal leiomyoma of uterus: Secondary | ICD-10-CM | POA: Diagnosis not present

## 2021-08-21 DIAGNOSIS — N950 Postmenopausal bleeding: Secondary | ICD-10-CM | POA: Diagnosis not present

## 2021-08-21 DIAGNOSIS — D219 Benign neoplasm of connective and other soft tissue, unspecified: Secondary | ICD-10-CM | POA: Insufficient documentation

## 2021-08-27 ENCOUNTER — Encounter: Payer: Self-pay | Admitting: Obstetrics and Gynecology

## 2021-08-27 DIAGNOSIS — N950 Postmenopausal bleeding: Secondary | ICD-10-CM

## 2021-08-27 NOTE — Addendum Note (Signed)
Addended by: Ardeth Perfect B on: 1/48/3073 05:11 PM   Modules accepted: Orders

## 2021-08-28 ENCOUNTER — Ambulatory Visit: Payer: Medicaid Other | Admitting: Cardiovascular Disease

## 2021-08-28 NOTE — Progress Notes (Deleted)
Cardiology Office Note  Date:  08/28/2021   ID:  [name], [name] Sep 06, 1965, MRN [id number]  PCP:  Jon Billings, NP   No chief complaint on file.   HPI:  Ms Mandy Alexander is a 56 year old woman with past medical history of Hypertension Referred by Vance Peper for consultation of her dyspnea on exertion  Seen by primary care July 10, 2021 for dyspnea on exertion Has been treated with prednisone, was feeling better   PMH:   has a past medical history of Hypertension.  PSH:    Past Surgical History:  Procedure Laterality Date   NOSE SURGERY     TUBAL LIGATION     UTERINE FIBROID SURGERY  12/2020    Current Outpatient Medications  Medication Sig Dispense Refill   albuterol (VENTOLIN HFA) 108 (90 Base) MCG/ACT inhaler Inhale 2 puffs into the lungs every 6 (six) hours as needed for wheezing or shortness of breath. (Patient not taking: Reported on 07/10/2021) 8 g 0   amLODipine (NORVASC) 5 MG tablet Take 1 tablet (5 mg total) by mouth daily. 90 tablet 1   triamcinolone (KENALOG) 0.025 % ointment Apply 1 application topically 2 (two) times daily. 30 g 1   No current facility-administered medications for this visit.     Allergies:   Other   Social History:  The patient  reports that she has never smoked. She has never used smokeless tobacco. She reports current alcohol use. She reports that she does not use drugs.   Family History:   family history includes Colon cancer in her maternal grandmother; Drug abuse in her father; Thyroid disease in her sister.    Review of Systems: ROS   PHYSICAL EXAM: VS:  There were no vitals taken for this visit. , BMI There is no height or weight on file to calculate BMI. GEN: Well nourished, well developed, in no acute distress HEENT: normal Neck: no JVD, carotid bruits, or masses Cardiac: RRR; no murmurs, rubs, or gallops,no edema  Respiratory:  clear to auscultation bilaterally, normal work of breathing GI: soft,  nontender, nondistended, + BS MS: no deformity or atrophy Skin: warm and dry, no rash Neuro:  Strength and sensation are intact Psych: euthymic mood, full affect    Recent Labs: 06/20/2021: ALT 32; TSH 1.450 06/27/2021: BUN 19; Creatinine, Ser 0.79; Hemoglobin 13.8; Platelets 320; Potassium 3.6; Sodium 137    Lipid Panel No results found for: CHOL, HDL, LDLCALC, TRIG    Wt Readings from Last 3 Encounters:  08/14/21 152 lb (68.9 kg)  07/23/21 149 lb (67.6 kg)  07/10/21 148 lb 3.2 oz (67.2 kg)       ASSESSMENT AND PLAN:  Problem List Items Addressed This Visit   None    Disposition:   F/U  12 months   Total encounter time more than 30 minutes  Greater than 50% was spent in counseling and coordination of care with the patient    Signed, Esmond Plants, M.D., Ph.D. [place], [place]

## 2021-08-28 NOTE — Telephone Encounter (Signed)
Spoke with pt's son who communication with pt.

## 2021-08-29 NOTE — Addendum Note (Signed)
Addended by: Ardeth Perfect B on: 10/04/2701 08:16 AM ? ? Modules accepted: Orders ? ?

## 2021-09-03 ENCOUNTER — Other Ambulatory Visit: Payer: Self-pay

## 2021-09-03 ENCOUNTER — Other Ambulatory Visit: Payer: Medicaid Other

## 2021-09-03 DIAGNOSIS — N950 Postmenopausal bleeding: Secondary | ICD-10-CM

## 2021-09-03 DIAGNOSIS — Z780 Asymptomatic menopausal state: Secondary | ICD-10-CM

## 2021-09-04 LAB — ESTRADIOL: Estradiol: 13.4 pg/mL

## 2021-09-04 LAB — FSH/LH
FSH: 86.2 m[IU]/mL
LH: 80.4 m[IU]/mL

## 2021-09-06 ENCOUNTER — Encounter: Payer: Self-pay | Admitting: Nurse Practitioner

## 2021-09-06 NOTE — Progress Notes (Signed)
Per SDJ, pls let [place] know pt appt needs extra time. Thx.

## 2021-09-27 DIAGNOSIS — N950 Postmenopausal bleeding: Secondary | ICD-10-CM | POA: Diagnosis not present

## 2021-09-27 DIAGNOSIS — N859 Noninflammatory disorder of uterus, unspecified: Secondary | ICD-10-CM | POA: Diagnosis not present

## 2021-10-09 ENCOUNTER — Ambulatory Visit
Admission: RE | Admit: 2021-10-09 | Discharge: 2021-10-09 | Disposition: A | Discharge status: Home / Self Care | Payer: Medicaid Other | Source: Ambulatory Visit | Attending: Nurse Practitioner | Admitting: Nurse Practitioner

## 2021-10-09 DIAGNOSIS — Z1231 Encounter for screening mammogram for malignant neoplasm of breast: Secondary | ICD-10-CM | POA: Insufficient documentation

## 2021-10-19 NOTE — Progress Notes (Signed)
? ?BP 131/84   Pulse 86   Temp 98.2 ?F (36.8 ?C) (Oral)   Ht 5' 3.5" (1.613 m)   Wt 148 lb 12.8 oz (67.5 kg)   SpO2 99%   BMI 25.95 kg/m?   ? ?Subjective:  ? ? Patient ID: Mandy Alexander, female    DOB: 10-04-1965, 56 y.o.   MRN: [id number] ? ?HPI: ?Klarisa Barman is a 56 y.o. female ? ?Chief Complaint  ?Patient presents with  ? Hypertension  ? Hypothyroidism  ? ?HYPERTENSION ?Hypertension status: controlled ?Satisfied with current treatment? no ?Duration of hypertension: years ?BP monitoring frequency:  not checking ?BP range:  ?BP medication side effects:  no ?Medication compliance: fair compliance ?Previous BP meds:amlodipine ?Aspirin: no ?Recurrent headaches: no ?Visual changes: no ?Palpitations: no ?Dyspnea: no ?Chest pain: no ?Lower extremity edema: no ?Dizzy/lightheaded: no ? ?Patient states she has been having some patches on her skin.  She was given cream that improved the itching but she is left with dry patches.  She stopped using the Triamcinolone cream once the other itching went away.  Now she is using regular lotion and the spots aren't going away.   ? ?THYROID DISEASE ?Thyroid control status:controlled ?Satisfied with current treatment? yes ?Medication side effects: no ?Medication compliance: excellent compliance ?Etiology of hypothyroidism:  ?Recent dose adjustment:no ?Fatigue: no ?Cold intolerance: no ?Heat intolerance: no ?Weight gain: no ?Weight loss: no ?Constipation: no ?Diarrhea/loose stools: no ?Palpitations: no ?Lower extremity edema: no ?Anxiety/depressed mood: no ? ? ?Relevant past medical, surgical, family and social history reviewed and updated as indicated. Interim medical history since our last visit reviewed. ?Allergies and medications reviewed and updated. ? ?Review of Systems  ?Constitutional:  Negative for fever and unexpected weight change.  ?Eyes:  Negative for visual disturbance.  ?Respiratory:  Negative for cough, chest tightness and shortness of breath.    ?Cardiovascular:  Negative for chest pain, palpitations and leg swelling.  ?Endocrine: Negative for cold intolerance.  ?Neurological:  Negative for dizziness and headaches.  ? ?Per HPI unless specifically indicated above ? ?   ?Objective:  ?  ?BP 131/84   Pulse 86   Temp 98.2 ?F (36.8 ?C) (Oral)   Ht 5' 3.5" (1.613 m)   Wt 148 lb 12.8 oz (67.5 kg)   SpO2 99%   BMI 25.95 kg/m?   ?Wt Readings from Last 3 Encounters:  ?10/22/21 148 lb 12.8 oz (67.5 kg)  ?08/14/21 152 lb (68.9 kg)  ?07/23/21 149 lb (67.6 kg)  ?  ?Physical Exam ?Vitals and nursing note reviewed.  ?Constitutional:   ?   General: She is not in acute distress. ?   Appearance: Normal appearance. She is normal weight. She is not ill-appearing, toxic-appearing or diaphoretic.  ?HENT:  ?   Head: Normocephalic.  ?   Right Ear: External ear normal.  ?   Left Ear: External ear normal.  ?   Nose: Nose normal.  ?   Mouth/Throat:  ?   Mouth: Mucous membranes are moist.  ?   Pharynx: Oropharynx is clear.  ?Eyes:  ?   General:     ?   Right eye: No discharge.     ?   Left eye: No discharge.  ?   Extraocular Movements: Extraocular movements intact.  ?   Conjunctiva/sclera: Conjunctivae normal.  ?   Pupils: Pupils are equal, round, and reactive to light.  ?Cardiovascular:  ?   Rate and Rhythm: Normal rate and regular rhythm.  ?   Heart sounds: No  murmur heard. ?Pulmonary:  ?   Effort: Pulmonary effort is normal. No respiratory distress.  ?   Breath sounds: Normal breath sounds. No wheezing or rales.  ?Musculoskeletal:  ?   Cervical back: Normal range of motion and neck supple.  ?Skin: ?   General: Skin is warm and dry.  ?   Capillary Refill: Capillary refill takes less than 2 seconds.  ? ?    ?Neurological:  ?   General: No focal deficit present.  ?   Mental Status: She is alert and oriented to person, place, and time. Mental status is at baseline.  ?Psychiatric:     ?   Mood and Affect: Mood normal.     ?   Behavior: Behavior normal.     ?   Thought Content:  Thought content normal.     ?   Judgment: Judgment normal.  ? ? ?Results for orders placed or performed in visit on 09/03/21  ?Estradiol  ?Result Value Ref Range  ? Estradiol 13.4 pg/mL  ?FSH/LH  ?Result Value Ref Range  ? LH 80.4 mIU/mL  ? FSH 86.2 mIU/mL  ? ?   ?Assessment & Plan:  ? ?Problem List Items Addressed This Visit   ? ?  ? Cardiovascular and Mediastinum  ? Essential hypertension - Primary  ?  Chronic.  Controlled.  Continue with current medication regimen of Amlodipine 62m daily.  Labs ordered today.  Return to clinic in 6 months for reevaluation.  Call sooner if concerns arise.  ? ? ?  ?  ? Relevant Medications  ? amLODipine (NORVASC) 5 MG tablet  ? Other Relevant Orders  ? Comp Met (CMET)  ?  ? Endocrine  ? Hyperthyroidism  ?  Chronic.  Controlled without medication at this time.  Has not made an appointment with Endocrinology.  Labs ordered today.  Return to clinic in 6 months for reevaluation.  Call sooner if concerns arise.  ? ? ?  ?  ? Relevant Orders  ? TSH  ? T4, free  ? ?Other Visit Diagnoses   ? ? Rash      ? Will give Lotrisone cream for dry patchy rashes on right arm. Discussed how to use medication. Follow up if symptoms worsen or fail to improve.  ? Need for shingles vaccine      ? Relevant Orders  ? Varicella-zoster vaccine IM (Shingrix) (Completed)  ? ?  ?  ? ?Follow up plan: ?Return in about 6 months (around 04/23/2022) for HTN, HLD, DM2 FU (second shingles). ? ? ? ? ? ?

## 2021-10-22 ENCOUNTER — Ambulatory Visit (INDEPENDENT_AMBULATORY_CARE_PROVIDER_SITE_OTHER): Payer: Medicaid Other | Admitting: Nurse Practitioner

## 2021-10-22 ENCOUNTER — Encounter: Payer: Self-pay | Admitting: Nurse Practitioner

## 2021-10-22 VITALS — BP 131/84 | HR 86 | Temp 98.2°F | Ht 63.5 in | Wt 148.8 lb

## 2021-10-22 DIAGNOSIS — I10 Essential (primary) hypertension: Secondary | ICD-10-CM

## 2021-10-22 DIAGNOSIS — E0590 Thyrotoxicosis, unspecified without thyrotoxic crisis or storm: Secondary | ICD-10-CM

## 2021-10-22 DIAGNOSIS — Z23 Encounter for immunization: Secondary | ICD-10-CM | POA: Diagnosis not present

## 2021-10-22 DIAGNOSIS — R21 Rash and other nonspecific skin eruption: Secondary | ICD-10-CM

## 2021-10-22 MED ORDER — AMLODIPINE BESYLATE 5 MG PO TABS: 5.0000 mg | ORAL_TABLET | Freq: Every day | ORAL | 1 refills | Status: DC

## 2021-10-22 MED ORDER — CLOTRIMAZOLE-BETAMETHASONE 1-0.05 % EX CREA: 1.0000 "application " | TOPICAL_CREAM | Freq: Every day | CUTANEOUS | 0 refills | Status: DC

## 2021-10-22 NOTE — Assessment & Plan Note (Signed)
Chronic.  Controlled without medication at this time.  Has not made an appointment with Endocrinology.  Labs ordered today.  Return to clinic in 6 months for reevaluation.  Call sooner if concerns arise.  ? ?

## 2021-10-22 NOTE — Assessment & Plan Note (Signed)
Chronic.  Controlled.  Continue with current medication regimen of Amlodipine 5mg daily.  Labs ordered today.  Return to clinic in 6 months for reevaluation.  Call sooner if concerns arise.   

## 2021-10-23 LAB — COMPREHENSIVE METABOLIC PANEL
ALT: 28 IU/L (ref 0–32)
AST: 26 IU/L (ref 0–40)
Albumin/Globulin Ratio: 1.5 (ref 1.2–2.2)
Albumin: 4.4 g/dL (ref 3.8–4.9)
Alkaline Phosphatase: 123 IU/L — ABNORMAL HIGH (ref 44–121)
BUN/Creatinine Ratio: 13 (ref 9–23)
BUN: 11 mg/dL (ref 6–24)
Bilirubin Total: 0.4 mg/dL (ref 0.0–1.2)
CO2: 21 mmol/L (ref 20–29)
Calcium: 9.7 mg/dL (ref 8.7–10.2)
Chloride: 104 mmol/L (ref 96–106)
Creatinine, Ser: 0.87 mg/dL (ref 0.57–1.00)
Globulin, Total: 3 g/dL (ref 1.5–4.5)
Glucose: 73 mg/dL (ref 70–99)
Potassium: 4.4 mmol/L (ref 3.5–5.2)
Sodium: 140 mmol/L (ref 134–144)
Total Protein: 7.4 g/dL (ref 6.0–8.5)
eGFR: 80 mL/min/{1.73_m2} (ref >59)

## 2021-10-23 LAB — TSH: TSH: 1.51 u[IU]/mL (ref 0.450–4.500)

## 2021-10-23 LAB — T4, FREE: Free T4: 1.58 ng/dL (ref 0.82–1.77)

## 2021-10-23 NOTE — Progress Notes (Signed)
Hi Mandy Alexander.  Your lab work looks great.  No need for medication for your thyroid medication at this time.  Your liver enzyme also improved which is great news.  Follow up as discussed.

## 2021-10-25 ENCOUNTER — Inpatient Hospital Stay
Admission: RE | Admit: 2021-10-25 | Discharge: 2021-10-25 | Disposition: A | Discharge status: Home / Self Care | Payer: Self-pay | Source: Ambulatory Visit | Attending: *Deleted | Admitting: *Deleted

## 2021-10-25 ENCOUNTER — Other Ambulatory Visit: Payer: Self-pay | Admitting: *Deleted

## 2021-10-25 DIAGNOSIS — Z1231 Encounter for screening mammogram for malignant neoplasm of breast: Secondary | ICD-10-CM

## 2021-10-25 NOTE — Progress Notes (Signed)
Please let patient know her Mammogram did not show any evidence of a malignancy.  The recommendation is to repeat the Mammogram in 1 year.  

## 2021-10-30 ENCOUNTER — Telehealth: Payer: Self-pay | Admitting: Obstetrics and Gynecology

## 2021-10-30 NOTE — Telephone Encounter (Signed)
Patient's son called to advise that his mother has seen Dr Glennon Mac at [facility] since March. She was sent by referral by Ardeth Perfect for a hysterectomy consult. She was sent outside of our office due to not having available providers to do the surgery. ? ?She is scheduled for surgery 11/13/21 with Dr Glennon Mac and had her pre-op appt with him yesterday, 10/29/21. The patient has just now found out that her insurance, [employer], is OON at [facility]. Her son is calling asking where his mother can go that is in network to have her surgery done. ? ?I will discuss with Denyse Dago to see if a referral to [facility] should be done. ? ?I told the son I would call him back once I had looked into things. ?

## 2021-10-30 NOTE — Telephone Encounter (Signed)
Son called back and said he had rec'd a call from [place] at [facility]? Stating that insurance was going to cover his mother's surgery. In kindness, I did advise him to confirm that her insurance has provided them with a prior authorization stating that they will cover her surgery. ?

## 2021-11-01 ENCOUNTER — Other Ambulatory Visit: Payer: Self-pay | Admitting: Obstetrics and Gynecology

## 2021-11-05 ENCOUNTER — Encounter
Admission: RE | Admit: 2021-11-05 | Discharge: 2021-11-05 | Disposition: A | Discharge status: Home / Self Care | Payer: Medicaid Other | Source: Ambulatory Visit | Attending: Obstetrics and Gynecology | Admitting: Obstetrics and Gynecology

## 2021-11-05 ENCOUNTER — Encounter: Payer: Self-pay | Admitting: *Deleted

## 2021-11-05 VITALS — BP 142/87 | HR 85 | Temp 98.4°F | Resp 18 | Ht 63.0 in | Wt 150.7 lb

## 2021-11-05 DIAGNOSIS — Z01812 Encounter for preprocedural laboratory examination: Secondary | ICD-10-CM | POA: Diagnosis present

## 2021-11-05 DIAGNOSIS — N921 Excessive and frequent menstruation with irregular cycle: Secondary | ICD-10-CM | POA: Insufficient documentation

## 2021-11-05 DIAGNOSIS — I10 Essential (primary) hypertension: Secondary | ICD-10-CM | POA: Insufficient documentation

## 2021-11-05 LAB — CBC
HCT: 41.3 % (ref 36.0–46.0)
Hemoglobin: 13.1 g/dL (ref 12.0–15.0)
MCH: 28.6 pg (ref 26.0–34.0)
MCHC: 31.7 g/dL (ref 30.0–36.0)
MCV: 90.2 fL (ref 80.0–100.0)
Platelets: 314 10*3/uL (ref 150–400)
RBC: 4.58 MIL/uL (ref 3.87–5.11)
RDW: 13.8 % (ref 11.5–15.5)
WBC: 8 10*3/uL (ref 4.0–10.5)
nRBC: 0 % (ref 0.0–0.2)

## 2021-11-05 LAB — TYPE AND SCREEN
ABO/RH(D): B POS
Antibody Screen: NEGATIVE

## 2021-11-05 NOTE — Patient Instructions (Addendum)
?Your procedure is scheduled on: Tuesday Nov 13, 2021. ?Report to Day Surgery inside [facility] 2nd floor, stop by admissions desk before getting on elevator.  ?To find out your arrival time please call [phone] between 1PM - 3PM on Monday Nov 12, 2021. ? ?Remember: Instructions that are not followed completely may result in serious medical risk,  ?up to and including death, or upon the discretion of your surgeon and anesthesiologist your  ?surgery may need to be rescheduled.  ? ?  _X__ 1. Do not eat food after midnight the night before your procedure. ?                No chewing gum or hard candies. You may drink clear liquids up to 2 hours ?                before you are scheduled to arrive for your surgery- DO not drink clear ?                liquids within 2 hours of the start of your surgery. ?                Clear Liquids include:  water, apple juice without pulp, clear Gatorade, G2 or  ?                Gatorade Zero (avoid Red/Purple/Blue), Black Coffee or Tea (Do not add ?                anything to coffee or tea). ? ?__X__2.   Complete the "Ensure Clear Pre-surgery Clear Carbohydrate Drink" provided to you, 2 hours before arrival. **If you are diabetic you will be provided with an alternative drink, Gatorade Zero or G2. ? ?__X__3.  On the morning of surgery brush your teeth with toothpaste and water, you ?               may rinse your mouth with mouthwash if you wish.  Do not swallow any toothpaste of mouthwash. ?   ? _X__ 4.  No Alcohol for 24 hours before or after surgery. ? ? _X__ 5.  Do Not Smoke or use e-cigarettes For 24 Hours Prior to Your Surgery. ?                Do not use any chewable tobacco products for at least 6 hours prior to ?                Surgery. ? ?_X__  6.  Do not use any recreational drugs (marijuana, cocaine, heroin, ecstasy, MDMA or other) ?               For at least one week prior to your surgery.  Combination of these drugs with anesthesia ?                May have life threatening results. ? ?____  7.  Bring all medications with you on the day of surgery if instructed.  ? ?__X__8.  Notify your doctor if there is any change in your medical condition  ?    (cold, fever, infections). ?    ?Do not wear jewelry, make-up, hairpins, clips or nail polish. ?Do not wear lotions, powders, or perfumes. You may wear deodorant. ?Do not shave 48 hours prior to surgery. Men may shave face and neck. ?Do not bring valuables to the hospital.   ? ?[facility] is not responsible for any belongings or valuables. ? ?Contacts,  dentures or bridgework may not be worn into surgery. ?Leave your suitcase in the car. After surgery it may be brought to your room. ?For patients admitted to the hospital, discharge time is determined by your ?treatment team. ?  ?Patients discharged the day of surgery will not be allowed to drive home.   ?Make arrangements for someone to be with you for the first 24 hours of your ?Same Day Discharge. ? ? ?__X__ Take these medicines the morning of surgery with A SIP OF WATER:  ? ? 1. amLODipine (NORVASC) 5 MG  ? 2.  ? 3.  ? 4. ? 5. ? 6. ? ?____ Fleet Enema (as directed)  ? ?__X__ Use CHG Soap (or wipes) as directed ? ?____ Use Benzoyl Peroxide Gel as instructed ? ?____ Use inhalers on the day of surgery ? ?____ Stop metformin 2 days prior to surgery   ? ?____ Take 1/2 of usual insulin dose the night before surgery. No insulin the morning ?         of surgery.  ? ?____ Call your PCP, cardiologist, or Pulmonologist if taking Coumadin/Plavix/aspirin and ask when to stop before your surgery.  ? ?__X__ One Week prior to surgery- Stop Anti-inflammatories such as Ibuprofen, Aleve, Advil, Motrin, meloxicam (MOBIC), diclofenac, etodolac, ketorolac, Toradol, Daypro, piroxicam, Goody's or BC powders. OK TO USE TYLENOL IF NEEDED ?  ?__X__ Stop supplements until after surgery.   ? ?____ Bring C-Pap to the hospital.  ? ? ?If you have any questions regarding your pre-procedure  instructions,  ?Please call Pre-admit Testing at [phone] ? ?

## 2021-11-06 LAB — RPR: RPR Ser Ql: NONREACTIVE

## 2021-11-13 ENCOUNTER — Encounter: Payer: Self-pay | Admitting: Obstetrics and Gynecology

## 2021-11-13 ENCOUNTER — Ambulatory Visit
Admission: RE | Admit: 2021-11-13 | Discharge: 2021-11-14 | Disposition: A | Discharge status: Home / Self Care | Payer: Medicaid Other | Attending: Obstetrics and Gynecology | Admitting: Obstetrics and Gynecology

## 2021-11-13 ENCOUNTER — Other Ambulatory Visit: Payer: Self-pay

## 2021-11-13 ENCOUNTER — Ambulatory Visit: Payer: Medicaid Other | Admitting: Registered Nurse

## 2021-11-13 ENCOUNTER — Ambulatory Visit: Payer: Medicaid Other | Admitting: Urgent Care

## 2021-11-13 ENCOUNTER — Encounter
Admission: RE | Disposition: A | Discharge status: Home / Self Care | Payer: Self-pay | Source: Home / Self Care | Attending: Obstetrics and Gynecology

## 2021-11-13 DIAGNOSIS — E0500 Thyrotoxicosis with diffuse goiter without thyrotoxic crisis or storm: Secondary | ICD-10-CM | POA: Insufficient documentation

## 2021-11-13 DIAGNOSIS — D251 Intramural leiomyoma of uterus: Secondary | ICD-10-CM | POA: Diagnosis not present

## 2021-11-13 DIAGNOSIS — Z419 Encounter for procedure for purposes other than remedying health state, unspecified: Secondary | ICD-10-CM

## 2021-11-13 DIAGNOSIS — N8003 Adenomyosis of the uterus: Secondary | ICD-10-CM | POA: Diagnosis not present

## 2021-11-13 DIAGNOSIS — N838 Other noninflammatory disorders of ovary, fallopian tube and broad ligament: Secondary | ICD-10-CM | POA: Insufficient documentation

## 2021-11-13 DIAGNOSIS — N921 Excessive and frequent menstruation with irregular cycle: Secondary | ICD-10-CM | POA: Diagnosis not present

## 2021-11-13 DIAGNOSIS — Z90710 Acquired absence of both cervix and uterus: Secondary | ICD-10-CM | POA: Diagnosis present

## 2021-11-13 DIAGNOSIS — D259 Leiomyoma of uterus, unspecified: Secondary | ICD-10-CM | POA: Diagnosis not present

## 2021-11-13 DIAGNOSIS — N83202 Unspecified ovarian cyst, left side: Secondary | ICD-10-CM | POA: Insufficient documentation

## 2021-11-13 HISTORY — PX: ROBOTIC ASSISTED TOTAL HYSTERECTOMY WITH BILATERAL SALPINGO OOPHERECTOMY: SHX6086

## 2021-11-13 HISTORY — PX: CYSTOSCOPY: SHX5120

## 2021-11-13 LAB — POCT PREGNANCY, URINE: Preg Test, Ur: NEGATIVE

## 2021-11-13 SURGERY — HYSTERECTOMY, TOTAL, ROBOT-ASSISTED, LAPAROSCOPIC, WITH BILATERAL SALPINGO-OOPHORECTOMY
Anesthesia: General

## 2021-11-13 MED ORDER — ONDANSETRON HCL 4 MG/2ML IJ SOLN
INTRAMUSCULAR | Status: DC | PRN
  Administered 2021-11-13: 4 mg via INTRAVENOUS

## 2021-11-13 MED ORDER — PROPOFOL 10 MG/ML IV BOLUS
INTRAVENOUS | Status: AC
  Filled 2021-11-13: qty 20

## 2021-11-13 MED ORDER — MIDAZOLAM HCL 2 MG/2ML IJ SOLN
INTRAMUSCULAR | Status: AC
  Filled 2021-11-13: qty 2

## 2021-11-13 MED ORDER — FENTANYL CITRATE (PF) 100 MCG/2ML IJ SOLN
INTRAMUSCULAR | Status: AC
  Administered 2021-11-13: 25 ug via INTRAVENOUS
  Filled 2021-11-13: qty 2

## 2021-11-13 MED ORDER — SUGAMMADEX SODIUM 200 MG/2ML IV SOLN
INTRAVENOUS | Status: DC | PRN
  Administered 2021-11-13: 100 mg via INTRAVENOUS
  Administered 2021-11-13: 200 mg via INTRAVENOUS

## 2021-11-13 MED ORDER — MENTHOL 3 MG MT LOZG
1.0000 | LOZENGE | OROMUCOSAL | Status: DC | PRN
  Administered 2021-11-13 (×2): 3 mg via ORAL
  Filled 2021-11-13 (×2): qty 9

## 2021-11-13 MED ORDER — CEFAZOLIN SODIUM-DEXTROSE 2-4 GM/100ML-% IV SOLN
INTRAVENOUS | Status: AC
  Filled 2021-11-13: qty 100

## 2021-11-13 MED ORDER — FENTANYL CITRATE (PF) 100 MCG/2ML IJ SOLN
INTRAMUSCULAR | Status: AC
Start: 2021-11-13 — End: ?
  Filled 2021-11-13: qty 2

## 2021-11-13 MED ORDER — LIDOCAINE HCL (PF) 2 % IJ SOLN
INTRAMUSCULAR | Status: AC
  Filled 2021-11-13: qty 5

## 2021-11-13 MED ORDER — DEXAMETHASONE SODIUM PHOSPHATE 10 MG/ML IJ SOLN
INTRAMUSCULAR | Status: DC | PRN
  Administered 2021-11-13: 8 mg via INTRAVENOUS

## 2021-11-13 MED ORDER — ONDANSETRON HCL 4 MG/2ML IJ SOLN
4.0000 mg | Freq: Four times a day (QID) | INTRAMUSCULAR | Status: DC | PRN
  Administered 2021-11-13: 4 mg via INTRAVENOUS
  Filled 2021-11-13: qty 2

## 2021-11-13 MED ORDER — LIDOCAINE HCL (CARDIAC) PF 100 MG/5ML IV SOSY
PREFILLED_SYRINGE | INTRAVENOUS | Status: DC | PRN
  Administered 2021-11-13: 60 mg via INTRAVENOUS

## 2021-11-13 MED ORDER — ROCURONIUM BROMIDE 100 MG/10ML IV SOLN
INTRAVENOUS | Status: DC | PRN
Start: 2021-11-13 — End: 2021-11-13
  Administered 2021-11-13: 20 mg via INTRAVENOUS
  Administered 2021-11-13: 10 mg via INTRAVENOUS
  Administered 2021-11-13: 40 mg via INTRAVENOUS

## 2021-11-13 MED ORDER — ONDANSETRON HCL 4 MG PO TABS: 4.0000 mg | ORAL_TABLET | Freq: Four times a day (QID) | ORAL | Status: DC | PRN

## 2021-11-13 MED ORDER — POVIDONE-IODINE 10 % EX SWAB: 2.0000 "application " | Freq: Once | CUTANEOUS | Status: DC

## 2021-11-13 MED ORDER — BUPIVACAINE HCL (PF) 0.5 % IJ SOLN
INTRAMUSCULAR | Status: AC
  Filled 2021-11-13: qty 30

## 2021-11-13 MED ORDER — DOCUSATE SODIUM 100 MG PO CAPS
100.0000 mg | ORAL_CAPSULE | Freq: Two times a day (BID) | ORAL | Status: DC
  Administered 2021-11-13 – 2021-11-14 (×2): 100 mg via ORAL
  Filled 2021-11-13 (×2): qty 1

## 2021-11-13 MED ORDER — SIMETHICONE 80 MG PO CHEW
80.0000 mg | CHEWABLE_TABLET | Freq: Four times a day (QID) | ORAL | Status: DC | PRN
  Administered 2021-11-14: 80 mg via ORAL
  Filled 2021-11-13: qty 1

## 2021-11-13 MED ORDER — FENTANYL CITRATE (PF) 100 MCG/2ML IJ SOLN
100.0000 ug | INTRAMUSCULAR | Status: AC
  Administered 2021-11-13 (×2): 25 ug via INTRAVENOUS

## 2021-11-13 MED ORDER — DEXMEDETOMIDINE HCL IN NACL 80 MCG/20ML IV SOLN
INTRAVENOUS | Status: AC
  Filled 2021-11-13: qty 20

## 2021-11-13 MED ORDER — EPHEDRINE 5 MG/ML INJ
INTRAVENOUS | Status: AC
  Filled 2021-11-13: qty 5

## 2021-11-13 MED ORDER — EPHEDRINE SULFATE (PRESSORS) 50 MG/ML IJ SOLN
INTRAMUSCULAR | Status: DC | PRN
  Administered 2021-11-13: 10 mg via INTRAVENOUS
  Administered 2021-11-13 (×3): 5 mg via INTRAVENOUS

## 2021-11-13 MED ORDER — CHLORHEXIDINE GLUCONATE 0.12 % MT SOLN: 15.0000 mL | Freq: Once | OROMUCOSAL | Status: AC

## 2021-11-13 MED ORDER — BUPIVACAINE HCL 0.5 % IJ SOLN
INTRAMUSCULAR | Status: DC | PRN
  Administered 2021-11-13: 4 mL
  Administered 2021-11-13: 10 mL

## 2021-11-13 MED ORDER — AMLODIPINE BESYLATE 5 MG PO TABS
5.0000 mg | ORAL_TABLET | Freq: Every day | ORAL | Status: DC
  Administered 2021-11-14: 5 mg via ORAL
  Filled 2021-11-13: qty 1

## 2021-11-13 MED ORDER — FAMOTIDINE 20 MG PO TABS
20.0000 mg | ORAL_TABLET | Freq: Once | ORAL | Status: AC
Start: 2021-11-13 — End: 2021-11-13

## 2021-11-13 MED ORDER — CEFAZOLIN SODIUM-DEXTROSE 2-4 GM/100ML-% IV SOLN
2.0000 g | INTRAVENOUS | Status: AC
  Administered 2021-11-13: 2 g via INTRAVENOUS

## 2021-11-13 MED ORDER — PROPOFOL 10 MG/ML IV BOLUS
INTRAVENOUS | Status: DC | PRN
  Administered 2021-11-13: 120 mg via INTRAVENOUS

## 2021-11-13 MED ORDER — ROCURONIUM BROMIDE 10 MG/ML (PF) SYRINGE
PREFILLED_SYRINGE | INTRAVENOUS | Status: AC
Start: 2021-11-13 — End: ?
  Filled 2021-11-13: qty 10

## 2021-11-13 MED ORDER — OXYCODONE-ACETAMINOPHEN 5-325 MG PO TABS
2.0000 | ORAL_TABLET | Freq: Four times a day (QID) | ORAL | Status: DC | PRN
  Administered 2021-11-13 – 2021-11-14 (×2): 2 via ORAL
  Filled 2021-11-13 (×2): qty 2

## 2021-11-13 MED ORDER — FENTANYL CITRATE (PF) 100 MCG/2ML IJ SOLN: 25.0000 ug | INTRAMUSCULAR | Status: DC | PRN

## 2021-11-13 MED ORDER — IBUPROFEN 600 MG PO TABS
600.0000 mg | ORAL_TABLET | Freq: Four times a day (QID) | ORAL | Status: DC
  Administered 2021-11-13 – 2021-11-14 (×3): 600 mg via ORAL
  Filled 2021-11-13 (×3): qty 1

## 2021-11-13 MED ORDER — APREPITANT 40 MG PO CAPS: 40.0000 mg | ORAL_CAPSULE | Freq: Once | ORAL | Status: DC

## 2021-11-13 MED ORDER — HYDROMORPHONE HCL 1 MG/ML IJ SOLN
INTRAMUSCULAR | Status: AC
  Filled 2021-11-13: qty 1

## 2021-11-13 MED ORDER — HYDROMORPHONE HCL 1 MG/ML IJ SOLN
0.5000 mg | INTRAMUSCULAR | Status: DC | PRN
  Administered 2021-11-13: 0.5 mg via INTRAVENOUS

## 2021-11-13 MED ORDER — ACETAMINOPHEN 10 MG/ML IV SOLN
INTRAVENOUS | Status: AC
Start: 2021-11-13 — End: ?
  Filled 2021-11-13: qty 100

## 2021-11-13 MED ORDER — FENTANYL CITRATE (PF) 100 MCG/2ML IJ SOLN
INTRAMUSCULAR | Status: DC | PRN
  Administered 2021-11-13 (×2): 50 ug via INTRAVENOUS

## 2021-11-13 MED ORDER — FAMOTIDINE 20 MG PO TABS
ORAL_TABLET | ORAL | Status: AC
  Administered 2021-11-13: 20 mg via ORAL
  Filled 2021-11-13: qty 1

## 2021-11-13 MED ORDER — ACETAMINOPHEN 10 MG/ML IV SOLN
INTRAVENOUS | Status: DC | PRN
  Administered 2021-11-13: 1000 mg via INTRAVENOUS

## 2021-11-13 MED ORDER — OXYCODONE HCL 5 MG PO TABS
5.0000 mg | ORAL_TABLET | ORAL | Status: AC
  Administered 2021-11-13: 5 mg via ORAL
  Filled 2021-11-13: qty 1

## 2021-11-13 MED ORDER — DEXAMETHASONE SODIUM PHOSPHATE 10 MG/ML IJ SOLN
INTRAMUSCULAR | Status: AC
Start: 2021-11-13 — End: ?
  Filled 2021-11-13: qty 1

## 2021-11-13 MED ORDER — LACTATED RINGERS IV SOLN: INTRAVENOUS | Status: DC

## 2021-11-13 MED ORDER — MIDAZOLAM HCL 2 MG/2ML IJ SOLN
INTRAMUSCULAR | Status: DC | PRN
  Administered 2021-11-13: 2 mg via INTRAVENOUS

## 2021-11-13 MED ORDER — KETOROLAC TROMETHAMINE 30 MG/ML IJ SOLN
INTRAMUSCULAR | Status: DC | PRN
  Administered 2021-11-13: 30 mg via INTRAVENOUS

## 2021-11-13 MED ORDER — ORAL CARE MOUTH RINSE: 15.0000 mL | Freq: Once | OROMUCOSAL | Status: AC

## 2021-11-13 MED ORDER — ONDANSETRON HCL 4 MG/2ML IJ SOLN
INTRAMUSCULAR | Status: AC
  Filled 2021-11-13: qty 2

## 2021-11-13 MED ORDER — OXYCODONE-ACETAMINOPHEN 5-325 MG PO TABS
1.0000 | ORAL_TABLET | Freq: Four times a day (QID) | ORAL | Status: DC | PRN
  Administered 2021-11-14: 1 via ORAL
  Filled 2021-11-13 (×2): qty 1

## 2021-11-13 MED ORDER — 0.9 % SODIUM CHLORIDE (POUR BTL) OPTIME
TOPICAL | Status: DC | PRN
  Administered 2021-11-13: 1000 mL

## 2021-11-13 MED ORDER — DEXMEDETOMIDINE (PRECEDEX) IN NS 20 MCG/5ML (4 MCG/ML) IV SYRINGE
PREFILLED_SYRINGE | INTRAVENOUS | Status: DC | PRN
  Administered 2021-11-13: 4 ug via INTRAVENOUS
  Administered 2021-11-13: 8 ug via INTRAVENOUS

## 2021-11-13 MED ORDER — DEXAMETHASONE SODIUM PHOSPHATE 10 MG/ML IJ SOLN
INTRAMUSCULAR | Status: AC
  Filled 2021-11-13: qty 1

## 2021-11-13 MED ORDER — CHLORHEXIDINE GLUCONATE 0.12 % MT SOLN
OROMUCOSAL | Status: AC
  Administered 2021-11-13: 15 mL via OROMUCOSAL
  Filled 2021-11-13: qty 15

## 2021-11-13 MED ORDER — ONDANSETRON HCL 4 MG/2ML IJ SOLN: 4.0000 mg | Freq: Once | INTRAMUSCULAR | Status: DC | PRN

## 2021-11-13 MED ORDER — GLYCOPYRROLATE 0.2 MG/ML IJ SOLN
INTRAMUSCULAR | Status: DC | PRN
Start: 2021-11-13 — End: 2021-11-13
  Administered 2021-11-13: .2 mg via INTRAVENOUS

## 2021-11-13 SURGICAL SUPPLY — 75 items
BACTOSHIELD CHG 4% 4OZ (MISCELLANEOUS) ×1
BAG URINE DRAIN 2000ML AR STRL (UROLOGICAL SUPPLIES) ×3 IMPLANT
BLADE SURG SZ11 CARB STEEL (BLADE) ×3 IMPLANT
CANNULA CAP OBTURATR AIRSEAL 8 (CAP) ×4 IMPLANT
CATH FOLEY 2WAY  5CC 16FR (CATHETERS) ×1
CATH URTH 16FR FL 2W BLN LF (CATHETERS) ×2 IMPLANT
CHLORAPREP W/TINT 26 (MISCELLANEOUS) ×2 IMPLANT
COVER MAYO STAND REUSABLE (DRAPES) ×2 IMPLANT
COVER TIP SHEARS 8 DVNC (MISCELLANEOUS) ×2 IMPLANT
COVER TIP SHEARS 8MM DA VINCI (MISCELLANEOUS) ×1
DEFOGGER SCOPE WARMER CLEARIFY (MISCELLANEOUS) ×3 IMPLANT
DERMABOND ADVANCED (GAUZE/BANDAGES/DRESSINGS) ×1
DERMABOND ADVANCED .7 DNX12 (GAUZE/BANDAGES/DRESSINGS) ×2 IMPLANT
DRAPE 3/4 80X56 (DRAPES) ×1 IMPLANT
DRAPE ARM DVNC X/XI (DISPOSABLE) ×8 IMPLANT
DRAPE COLUMN DVNC XI (DISPOSABLE) IMPLANT
DRAPE DA VINCI XI ARM (DISPOSABLE) ×4
DRAPE DA VINCI XI COLUMN (DISPOSABLE) ×1
DRAPE ROBOT W/ LEGGING 30X125 (DRAPES) ×3 IMPLANT
DRAPE UNDER BUTTOCK W/FLU (DRAPES) ×3 IMPLANT
ELECT REM PT RETURN 9FT ADLT (ELECTROSURGICAL) ×3
ELECTRODE REM PT RTRN 9FT ADLT (ELECTROSURGICAL) ×2 IMPLANT
GAUZE 4X4 16PLY ~~LOC~~+RFID DBL (SPONGE) ×3 IMPLANT
GLOVE BIO SURGEON STRL SZ7 (GLOVE) ×9 IMPLANT
GLOVE SURG UNDER POLY LF SZ7.5 (GLOVE) ×9 IMPLANT
GOWN STRL REUS W/ TWL LRG LVL3 (GOWN DISPOSABLE) ×6 IMPLANT
GOWN STRL REUS W/ TWL XL LVL3 (GOWN DISPOSABLE) ×2 IMPLANT
GOWN STRL REUS W/TWL LRG LVL3 (GOWN DISPOSABLE) ×3
GOWN STRL REUS W/TWL XL LVL3 (GOWN DISPOSABLE) ×1
GYRUS RUMI II 3.5CM BLUE (DISPOSABLE) ×3
IRRIGATION STRYKERFLOW (MISCELLANEOUS) IMPLANT
IRRIGATOR STRYKERFLOW (MISCELLANEOUS)
IV LACTATED RINGERS 1000ML (IV SOLUTION) ×3 IMPLANT
IV NS 1000ML (IV SOLUTION) ×1
IV NS 1000ML BAXH (IV SOLUTION) ×2 IMPLANT
KIT PINK PAD W/HEAD ARE REST (MISCELLANEOUS) ×3
KIT PINK PAD W/HEAD ARM REST (MISCELLANEOUS) ×2 IMPLANT
KIT TURNOVER CYSTO (KITS) ×3 IMPLANT
LABEL OR SOLS (LABEL) ×3 IMPLANT
MANIFOLD NEPTUNE II (INSTRUMENTS) ×3 IMPLANT
MANIPULATOR VCARE LG CRV RETR (MISCELLANEOUS) ×1 IMPLANT
MANIPULATOR VCARE SML CRV RETR (MISCELLANEOUS) IMPLANT
MANIPULATOR VCARE STD CRV RETR (MISCELLANEOUS) IMPLANT
NEEDLE HYPO 22GX1.5 SAFETY (NEEDLE) ×3 IMPLANT
NS IRRIG 500ML POUR BTL (IV SOLUTION) ×3 IMPLANT
OBTURATOR OPTICAL STANDARD 8MM (TROCAR) ×1
OBTURATOR OPTICAL STND 8 DVNC (TROCAR) ×2
OBTURATOR OPTICALSTD 8 DVNC (TROCAR) ×2 IMPLANT
OCCLUDER COLPOPNEUMO (BALLOONS) ×4 IMPLANT
PACK LAP CHOLECYSTECTOMY (MISCELLANEOUS) ×3 IMPLANT
PAD OB MATERNITY 4.3X12.25 (PERSONAL CARE ITEMS) ×3 IMPLANT
PAD PREP 24X41 OB/GYN DISP (PERSONAL CARE ITEMS) ×3 IMPLANT
RUMI II GYRUS 3.5CM BLUE (DISPOSABLE) IMPLANT
SCISSORS METZENBAUM CVD 33 (INSTRUMENTS) IMPLANT
SCRUB CHG 4% DYNA-HEX 4OZ (MISCELLANEOUS) ×2 IMPLANT
SEAL CANN UNIV 5-8 DVNC XI (MISCELLANEOUS) ×8 IMPLANT
SEAL XI 5MM-8MM UNIVERSAL (MISCELLANEOUS) ×5
SEALER VESSEL DA VINCI XI (MISCELLANEOUS) ×1
SEALER VESSEL EXT DVNC XI (MISCELLANEOUS) ×2 IMPLANT
SET CYSTO W/LG BORE CLAMP LF (SET/KITS/TRAYS/PACK) ×3 IMPLANT
SET TUBE FILTERED XL AIRSEAL (SET/KITS/TRAYS/PACK) ×3 IMPLANT
SET TUBE SMOKE EVAC HIGH FLOW (TUBING) ×1 IMPLANT
SOLUTION ELECTROLUBE (MISCELLANEOUS) ×3 IMPLANT
SPONGE T-LAP 18X18 ~~LOC~~+RFID (SPONGE) IMPLANT
SURGILUBE 2OZ TUBE FLIPTOP (MISCELLANEOUS) ×3 IMPLANT
SUT MNCRL 4-0 (SUTURE) ×1
SUT MNCRL 4-0 27XMFL (SUTURE) ×2
SUT STRATAFIX 0 PDS+ CT-2 23 (SUTURE) ×3
SUT VIC AB 0 CT1 36 (SUTURE) ×1 IMPLANT
SUTURE MNCRL 4-0 27XMF (SUTURE) ×2 IMPLANT
SUTURE STRATFX 0 PDS+ CT-2 23 (SUTURE) ×2 IMPLANT
SYR 10ML LL (SYRINGE) ×3 IMPLANT
SYR 50ML LL SCALE MARK (SYRINGE) ×3 IMPLANT
TIP UTERINE 6.7X8CM BLUE DISP (MISCELLANEOUS) ×1 IMPLANT
WATER STERILE IRR 500ML POUR (IV SOLUTION) ×3 IMPLANT

## 2021-11-13 NOTE — H&P (Signed)
Preoperative History and Physical ?  ?Mandy Alexander is a 56 y.o. G2P2 here for surgical management of menorrhagia with irregular cycle.   No significant preoperative concerns. ?  ?History of Present Illness: 56 y.o. G2P2 female who presents for irregular menses. There is a question as to whether she has gone through menopause.  Her irregular bleeding started in February.  She thinks she stopped having bleeding about 1-1.5 years ago. Then she started bleeding again.  The recent bleeding has been heavy.  She does have pain on her right side.  She had a fibroid surgery in 12/2020.  She was bleeding off and on up until that surgery.  She states that the surgery was done vaginally.  She greatly desires a hysterectomy for treatment of her bleeding.  She does have hyperthyroidism, but is off medication at this time.  Her last TSH was 05/2021 and was 1.45.   ?  ?Pap smear 08/14/2021: ASCUS, HPV negative ?[name] 09/03/2021: 86.2 (should suggest menopause) ?[facility] 09/03/2021: 80.4 (inconclusive) ?Estradiol 09/03/2021: 13.4 (inconclusive) ?  ?Pelvic ultrasound report (08/21/2021): ?FINDINGS:  ?Uterus  ? ?Measurements: 7.9 x 4.9 x 4.7 cm = volume: 96 mL. Multiple uterine  ?masses consistent with fibroids. Right fundal fibroid measures 18 by  ?24 x 19 mm. Posterior subserosal fibroid in the uterine corpus  ?measures 11 x 17 x 17 mm. Adjacent smaller intramural fibroid  ?measuring 10 x 13 x 14 mm.  ? ?Endometrium  ? ?Thickness: 8 mm.  No focal abnormality visualized.  ? ?Right ovary  ? ?Not seen  ? ?Left ovary  ? ?Not seen  ? ?Other findings  ? ?No abnormal free fluid.  ? ?IMPRESSION:  ?1. Endometrial thickness of 8 mm. In the setting of post-menopausal  ?bleeding, endometrial sampling is indicated to exclude carcinoma. If  ?results are benign, sonohysterogram should be considered for focal  ?lesion work-up. (Ref: Radiological Reasoning: Algorithmic Workup of  ?Abnormal Vaginal Bleeding with Endovaginal Sonography and  ?Sonohysterography. AJR  2008; 322:G25-42)  ?2. Several small uterine fibroids  ?3. Nonvisualized ovaries  ?  ?Endometrial biopsy: disordered proliferative phase endometrium.  No evidence of cancer noted.  ?  ?Proposed surgery: Robot assisted total laparoscopic hysterectomy, bilateral salpingo-oophorectomy, cystoscopy  ?  ?    ?Past Medical History:  ?Diagnosis Date  ? Dyspnea on exertion    ? Essential hypertension    ? Graves disease 12/2018  ? Hearing impaired    ? Hyperthyroidism    ? Leiomyoma    ? Sebaceous gland hyperplasia of vulva    ?  ?     ?Past Surgical History:  ?Procedure Laterality Date  ? uterine fibroid surgery   12/2020  ? nose surgery      ? tubal ligation      ?  ?                 ?OB History  ?Gravida Para Term Preterm AB Living  ?2 2          ?SAB IAB Ectopic Molar Multiple Live Births   ?             ?   ?# Outcome Date GA Lbr Len/2nd Weight Sex Delivery Anes PTL Lv  ?2 Para                    ?1 Para                    ?Patient denies any other  pertinent gynecologic issues.  ?  ?      ?Current Outpatient Medications on File Prior to Visit  ?Medication Sig Dispense Refill  ? amLODIPine (NORVASC) 5 MG tablet Take 5 mg by mouth once daily      ? clotrimazole-betamethasone (LOTRISONE) 1-0.05 % cream APPLY 1 APPLICATION. TOPICALLY DAILY.      ? methIMAzole (TAPAZOLE) 10 MG tablet Take 10 mg by mouth 3 (three) times daily      ? methIMAzole (TAPAZOLE) 10 MG tablet Take 1 tablet (10 mg total) by mouth once daily 30 tablet 3  ?  ?No current facility-administered medications on file prior to visit.  ?  ?No Known Allergies ?  ?Social History:   reports that Trinette Alcaide Podgorski has never smoked. Lockie Seelig Bates has never used smokeless tobacco. Cheyene Trotta Noell reports current alcohol use. ?  ?     ?Family History  ?Problem Relation Age of Onset  ? Drug abuse Father    ? Thyroid disease Sister    ? Colon cancer Maternal Grandmother    ?  ?  ?Review of Systems: Noncontributory ?  ?PHYSICAL EXAM: ?Blood  pressure 132/86, pulse 97, height 157.5 cm ('5\' 2"'$ ), weight 67.9 kg (149 lb 9.6 oz). ?CONSTITUTIONAL: Well-developed, well-nourished female in no acute distress.  ?HENT:  Normocephalic, atraumatic, External right and left ear normal. Oropharynx is clear and moist ?EYES: Conjunctivae and EOM are normal. Pupils are equal, round, and reactive to light. No scleral icterus.  ?NECK: Normal range of motion, supple, no masses ?SKIN: Skin is warm and dry. No rash noted. Not diaphoretic. No erythema. No pallor. ?[place]: Alert and oriented to person, place, and time. Normal reflexes, muscle tone coordination. No cranial nerve deficit noted. ?PSYCHIATRIC: Normal mood and affect. Normal behavior. Normal judgment and thought content. ?CARDIOVASCULAR: Normal heart rate noted, regular rhythm ?RESPIRATORY: Effort and breath sounds normal, no problems with respiration noted ?ABDOMEN: Soft, nontender, nondistended. ?PELVIC: Deferred ?MUSCULOSKELETAL: Normal range of motion. No edema and no tenderness. 2+ distal pulses. ?  ?Labs: ?Recent Results  ?No results found for this or any previous visit (from the past 336 hour(s)).  ? ?  ?Imaging Studies: ?No results found. ?  ?Assessment: ?1. Menorrhagia with irregular cycle   ?  ?  ?Plan: ?Patient will undergo surgical management with the above noted surgery.   The risks of surgery were discussed in detail with the patient including but not limited to: bleeding which may require transfusion or reoperation; infection which may require antibiotics; injury to surrounding organs which may involve bowel, bladder, ureters ; need for additional procedures including laparoscopy or laparotomy; thromboembolic phenomenon, surgical site problems and other postoperative/anesthesia complications. Likelihood of success in alleviating the patient's condition was discussed. Routine postoperative instructions will be reviewed with the patient and her family in detail after surgery.  The patient concurred  with the proposed plan, giving informed written consent for the surgery.  Preoperative prophylactic antibiotics, as indicated, and SCDs ordered on call to the OR.  Consents obtained with the help of an Child psychotherapist. ? ?Prentice Docker, MD, FACOG ?[facility] ?11/13/2021 7:21 AM   ?  ?

## 2021-11-13 NOTE — Anesthesia Preprocedure Evaluation (Signed)
Anesthesia Evaluation  ?Patient identified by MRN, date of birth, ID band ?Patient awake ? ? ? ?Reviewed: ?Allergy & Precautions, H&P , NPO status , Patient's Chart, lab work & pertinent test results, reviewed documented beta blocker date and time  ? ?Airway ?Mallampati: II ? ?TM Distance: >3 FB ?Neck ROM: full ? ? ? Dental ? ?(+) Teeth Intact ?  ?Pulmonary ?neg pulmonary ROS,  ?  ?Pulmonary exam normal ? ? ? ? ? ? ? Cardiovascular ?hypertension, negative cardio ROS ?Normal cardiovascular exam ?Rhythm:regular Rate:Normal ? ? ?  ?Neuro/Psych ?negative neurological ROS ? negative psych ROS  ? GI/Hepatic ?negative GI ROS, Neg liver ROS,   ?Endo/Other  ?negative endocrine ROS ? Renal/GU ?negative Renal ROS  ?negative genitourinary ?  ?Musculoskeletal ? ? Abdominal ?  ?Peds ? Hematology ?negative hematology ROS ?(+)   ?Anesthesia Other Findings ?Past Medical History: ?No date: Hypertension ?Past Surgical History: ?No date: NOSE SURGERY ?No date: TUBAL LIGATION ?12/2020: UTERINE FIBROID SURGERY ?BMI   ? Body Mass Index: 24.80 kg/m?  ?  ? Reproductive/Obstetrics ?negative OB ROS ? ?  ? ? ? ? ? ? ? ? ? ? ? ? ? ?  ?  ? ? ? ? ? ? ? ? ?Anesthesia Physical ?Anesthesia Plan ? ?ASA: 2 ? ?Anesthesia Plan: General ETT  ? ?Post-op Pain Management:   ? ?Induction:  ? ?PONV Risk Score and Plan:  ? ?Airway Management Planned:  ? ?Additional Equipment:  ? ?Intra-op Plan:  ? ?Post-operative Plan:  ? ?Informed Consent: I have reviewed the patients History and Physical, chart, labs and discussed the procedure including the risks, benefits and alternatives for the proposed anesthesia with the patient or authorized representative who has indicated his/her understanding and acceptance.  ? ? ? ?Dental Advisory Given ? ?Plan Discussed with: CRNA ? ?Anesthesia Plan Comments:   ? ? ? ? ? ? ?Anesthesia Quick Evaluation ? ?

## 2021-11-13 NOTE — Anesthesia Procedure Notes (Signed)
Procedure Name: Intubation ?Date/Time: 11/13/2021 7:47 AM ?Performed by: Hedda Slade, CRNA ?Pre-anesthesia Checklist: Patient identified, Patient being monitored, Timeout performed, Emergency Drugs available and Suction available ?Patient Re-evaluated:Patient Re-evaluated prior to induction ?Oxygen Delivery Method: Circle system utilized ?Preoxygenation: Pre-oxygenation with 100% oxygen ?Induction Type: IV induction ?Ventilation: Mask ventilation without difficulty ?Laryngoscope Size: 3 and McGraph ?Grade View: Grade I ?Tube type: Oral ?Tube size: 6.5 mm ?Number of attempts: 1 ?Airway Equipment and Method: Stylet ?Placement Confirmation: ETT inserted through vocal cords under direct vision, positive ETCO2 and breath sounds checked- equal and bilateral ?Secured at: 21 cm ?Tube secured with: Tape ?Dental Injury: Teeth and Oropharynx as per pre-operative assessment  ? ? ? ? ?

## 2021-11-13 NOTE — Transfer of Care (Signed)
Immediate Anesthesia Transfer of Care Note ? ?Patient: Mandy Alexander ? ?Procedure(s) Performed: XI ROBOTIC ASSISTED TOTAL HYSTERECTOMY WITH BILATERAL SALPINGO OOPHORECTOMY (Bilateral) ?CYSTOSCOPY ? ?Patient Location: PACU ? ?Anesthesia Type:General ? ?Level of Consciousness: awake, alert  and oriented ? ?Airway & Oxygen Therapy: Patient Spontanous Breathing ? ?Post-op Assessment: Report given to RN and Post -op Vital signs reviewed and stable ? ?Post vital signs: Reviewed and stable ? ?Last Vitals:  ?Vitals Value Taken Time  ?BP 106/65 11/13/21 1135  ?Temp    ?Pulse 62 11/13/21 1138  ?Resp 15 11/13/21 1138  ?SpO2 100 % 11/13/21 1138  ?Vitals shown include unvalidated device data. ? ?Last Pain:  ?Vitals:  ? 11/13/21 0637  ?TempSrc: Oral  ?PainSc: 0-No pain  ?   ? ?  ? ?Complications: No notable events documented. ?

## 2021-11-13 NOTE — Op Note (Signed)
?Operative Note   ? ?Name: Mandy Alexander  ?Date of Service: 11/13/2021  ?DOB: 05-05-1966  ?MRN: [id number]  ? ?Pre-Operative Diagnosis:  ?1) Menorrhagia with irregular cycle [N92.1] ?2) Fibroid Uterus [D25.9] ? ?Post-Operative Diagnosis:  ?1) Menorrhagia with irregular cycle [N92.1] ?2) Fibroid Uterus [D25.9] ? ?Procedures:  ?1. Robot assisted Total Laparoscopic Hysterectomy with bilateral salpingo-oophorectomy ?2. Cystoscopy ? ?Primary Surgeon: Prentice Docker, MD ?  ?EBL: 25 mL  ? ?IVF: 1,000 mL  ? ?Urine output: 1,300 mL clear urine at end of procedure ? ?Specimens: uterus with cervix, bilateral fallopian tube and ovaries ? ?Drains: none ? ?Complications: None  ? ?Disposition: PACU  ? ?Condition: Stable  ? ?Findings:  ?1) enlarged, fibroid uterus with normal appearing bilateral fallopian tubes and ovaries. ?2) enlarged cervix of uncertain pathologic significance ?3) Normal bladder on cystoscopy with efflux of urine from the bilateral ureteral orifices without evidence of damage to bladder. ? ?Procedure Summary:  ?The patient was taken to the operating room where general anesthesia was administered and found to be adequate. She was placed in the dorsal supine lithotomy position in [place] stirrups and prepped and draped in usual sterile fashion. After a timeout was called an indwelling catheter was placed in her bladder.  A sterile speculum was placed in her vagina.  The anterior lip of the cervix was grasped with the single-tooth tenaculum.  The cervix was serially dilated to an 11 Pratt dilator.  The large RUMI device was placed in accordance to the manufacturer's recommendations.  The speculum was removed. ?  ?Attention was turned to the abdomen where after injection of local anesthetic, an 8 mm supraumbilical incision was made with the scalpel. Entry into the abdomen was obtained via Optiview trocar technique (a blunt entry technique with camera visualization through the obturator upon entry). Verification of  entry into the abdomen was obtained using opening pressures. The abdomen was insufflated with CO2. The camera was introduced through the trocar with verification of atraumatic entry.  Right and left abdominal entry sites were created after injection of local anesthetic about 8 cm lateral to the umbilical port in accordance with the Intuitive manufacturer's recommendations.  An additional port was placed 8 cm lateral to the right abdominal port with verification of clearance above the iliac crest by more than 2 cm.  The port sites were 8 mm.  The intuitive trochars were introduced under intra-abdominal camera visualization without difficulty ?  ?The XI robot was docked on the patient's left.  Clearance was verified from the patient's legs.  Through the umbilical port the camera was placed.  Through the port attached to arm 3 the monopolar scissors were placed.  Through the port attached to arm 4 the forced bipolar forceps were was placed.  The vessel sealer was attached to port 1. ?  ?After inspection of the abdomen and pelvis with the above-noted findings, the bilateral ureters were identified and found to be well away from the operative area of interest. The right infundibulopelvic ligament was cauterized and ligated near the ovary, moving medially toward the round ligament. The vessel sealer was used to transect the right round ligament. Tissue was divided along the right broad ligament to the level of the interior cervical os. Bladder tissue were dissected off the lower uterine segment and cervix without difficulty. The right uterine artery was skeletonized and identified and after ligation was transected with the Vessel Sealer device. The same procedure was carried out on the left side. The colpotomy was performed using monopolar  electrocautery in a circumferential fashion following the KOH ring.  The uterus. fallopian tubes with ovaries, and cervix were removed through the vagina. ?  ?Closure of the vaginal  cuff was undertaken using the Stratafix stitch in a running fashion. All vascular pedicles were inspected and found to be hemostatic.  The intra-abdominal pressures was lowered to 5 mmHg to verify ongoing hemostasis.  Once this was verified, all instruments removed from the robotic ports.  The robot was undocked from the patient.  The abdomen was then desufflated of CO2.  All trochars were then removed.  All skin incisions were closed using 4-0 Vicryl in a subcuticular fashion and reinforced using surgical skin glue. ?  ?Cystoscopy was undertaken at this point. The Foley catheter was removed and the 70? cystoscope was gently introduced through the urethra. The bladder survey was undertaken with efflux of urine from both orifices noted. There were no defects noted in the bladder wall. The cystoscope was removed and the Foley catheter was utilized to fully empty the bladder. The catheter was removed. ? ?The patient tolerated the procedure well.  Sponge, lap, needle, and instrument counts were correct x 2.  VTE prophylaxis: SCDs. Antibiotic prophylaxis: Ancef 2 grams prior to skin incision. She was awakened in the operating room and was taken to the PACU in stable condition.  ? ?Prentice Docker, MD ?11/13/2021 11:28 AM   ?

## 2021-11-14 ENCOUNTER — Encounter: Payer: Self-pay | Admitting: Obstetrics and Gynecology

## 2021-11-14 DIAGNOSIS — N921 Excessive and frequent menstruation with irregular cycle: Secondary | ICD-10-CM | POA: Diagnosis not present

## 2021-11-14 LAB — SURGICAL PATHOLOGY

## 2021-11-14 MED ORDER — IBUPROFEN 100 MG/5ML PO SUSP: 600.0000 mg | Freq: Four times a day (QID) | ORAL | 2 refills | Status: DC

## 2021-11-14 MED ORDER — ONDANSETRON 4 MG PO TBDP: 4.0000 mg | ORAL_TABLET | Freq: Four times a day (QID) | ORAL | 0 refills | Status: DC | PRN

## 2021-11-14 MED ORDER — IBUPROFEN 600 MG PO TABS
600.0000 mg | ORAL_TABLET | Freq: Four times a day (QID) | ORAL | Status: DC
  Administered 2021-11-14: 600 mg via ORAL
  Filled 2021-11-14: qty 1

## 2021-11-14 MED ORDER — OXYCODONE-ACETAMINOPHEN 5-325 MG/5ML PO SOLN: 5.0000 mL | Freq: Four times a day (QID) | ORAL | 0 refills | Status: AC | PRN

## 2021-11-14 NOTE — Discharge Summary (Signed)
DC Summary ?Discharge Summary  ? ?Patient ID: ?Mandy Alexander ?[id number] ?56 y.o. ?May 02, 1966 ? ?Admit date: 11/13/2021 ? ?Discharge date: 11/14/2021 ? ?Principal Diagnoses:  ?1) menorrhagia with irregular cycle ?2) fibroid uterus ? ?Secondary Diagnoses:  ?none ? ?Procedures performed during the hospitalization:  ?Robot assisted Total Laparoscopic Hysterectomy, bilateral salpingecto-oophorectomy, cystoscopy on 11/13/21  ? ?HPI: 56 y.o. G2P2 female who presents for irregular menses. There is a question as to whether she has gone through menopause.  Her irregular bleeding started in February.  She thinks she stopped having bleeding about 1-1.5 years ago. Then she started bleeding again.  The recent bleeding has been heavy.  She does have pain on her right side.  She had a fibroid surgery in 12/2020.  She was bleeding off and on up until that surgery.  She states that the surgery was done vaginally.  She greatly desires a hysterectomy for treatment of her bleeding.  She does have hyperthyroidism, but is off medication at this time.  Her last TSH was 05/2021 and was 1.45.   ?  ?Pap smear 08/14/2021: ASCUS, HPV negative ?[facility] 09/03/2021: 86.2 (should suggest menopause) ?[facility] 09/03/2021: 80.4 (inconclusive) ?Estradiol 09/03/2021: 13.4 (inconclusive) ?  ?Pelvic ultrasound report (08/21/2021): ?FINDINGS:  ?Uterus  ? ?Measurements: 7.9 x 4.9 x 4.7 cm = volume: 96 mL. Multiple uterine  ?masses consistent with fibroids. Right fundal fibroid measures 18 by  ?24 x 19 mm. Posterior subserosal fibroid in the uterine corpus  ?measures 11 x 17 x 17 mm. Adjacent smaller intramural fibroid  ?measuring 10 x 13 x 14 mm.  ? ?Endometrium  ? ?Thickness: 8 mm.  No focal abnormality visualized.  ? ?Right ovary  ? ?Not seen  ? ?Left ovary  ? ?Not seen  ? ?Other findings  ? ?No abnormal free fluid.  ? ?IMPRESSION:  ?1. Endometrial thickness of 8 mm. In the setting of post-menopausal  ?bleeding, endometrial sampling is indicated to exclude carcinoma. If   ?results are benign, sonohysterogram should be considered for focal  ?lesion work-up. (Ref: Radiological Reasoning: Algorithmic Workup of  ?Abnormal Vaginal Bleeding with Endovaginal Sonography and  ?Sonohysterography. AJR 2008; 761:P50-93)  ?2. Several small uterine fibroids  ?3. Nonvisualized ovaries  ?  ?Endometrial biopsy: disordered proliferative phase endometrium.  No evidence of cancer noted.  ? ?Past Medical History:  ?Diagnosis Date  ? Hypertension   ? ? ?Past Surgical History:  ?Procedure Laterality Date  ? NOSE SURGERY    ? TUBAL LIGATION    ? UTERINE FIBROID SURGERY  12/2020  ? ? ?Allergies  ?Allergen Reactions  ? Other Rash  ?  Nuts and bananas. Causes upset stomach  ? ? ?Social History  ? ?Tobacco Use  ? Smoking status: Never  ? Smokeless tobacco: Never  ?Vaping Use  ? Vaping Use: Never used  ?Substance Use Topics  ? Alcohol use: Not Currently  ? Drug use: Never  ? ? ?Family History  ?Problem Relation Age of Onset  ? Drug abuse Father   ? Thyroid disease Sister   ? Colon cancer Maternal Grandmother   ? ? ?Hospital Course:  ?The patient was taken to the OR on 11/13/21 for a robot assisted TLH/BSO/Cystoscopy, which occurred without difficulty.  She was monitored in observation overnight.  By the next morning, she was ambulating, tolerating PO, voiding spontaneously, had adequate pain control, and had normal vital signs.  She was deemed stable for discharge.  ? ?Discharge Exam: ?BP 126/81   Pulse 83   Temp (!) 97.4 ?  F (36.3 ?C) (Oral)   Resp 18   Ht '5\' 3"'$  (1.6 m)   Wt 63.5 kg   LMP 10/22/2021 (Approximate)   SpO2 99%   BMI 24.80 kg/m?  ?Physical Exam ?Constitutional:   ?   General: She is not in acute distress. ?   Appearance: Normal appearance. She is well-developed.  ?HENT:  ?   Head: Normocephalic and atraumatic.  ?Eyes:  ?   General: No scleral icterus. ?   Conjunctiva/sclera: Conjunctivae normal.  ?Cardiovascular:  ?   Rate and Rhythm: Normal rate and regular rhythm.  ?   Heart sounds: No  murmur heard. ?  No friction rub. No gallop.  ?Pulmonary:  ?   Effort: Pulmonary effort is normal. No respiratory distress.  ?   Breath sounds: Normal breath sounds. No wheezing or rales.  ?Abdominal:  ?   General: Bowel sounds are normal. There is no distension.  ?   Palpations: Abdomen is soft. There is no mass.  ?   Tenderness: There is no abdominal tenderness. There is no guarding or rebound.  ?   Comments: Incisions: without erythema, induration, warmth, and tenderness. They are clean, dry, and intact.  ?   ?Musculoskeletal:     ?   General: Normal range of motion.  ?   Cervical back: Normal range of motion and neck supple.  ?Neurological:  ?   General: No focal deficit present.  ?   Mental Status: She is alert and oriented to person, place, and time.  ?   Cranial Nerves: No cranial nerve deficit.  ?Skin: ?   General: Skin is warm and dry.  ?   Findings: No erythema.  ?Psychiatric:     ?   Mood and Affect: Mood normal.     ?   Behavior: Behavior normal.     ?   Judgment: Judgment normal.  ?  ? ?Condition at Discharge: Stable ? ?Complications affecting treatment: None ? ?Discharge Medications:  ?Allergies as of 11/14/2021   ? ?   Reactions  ? Other Rash  ? Nuts and bananas. Causes upset stomach  ? ?  ? ?  ?Medication List  ?  ? ?STOP taking these medications   ? ?clotrimazole-betamethasone cream ?Commonly known as: LOTRISONE ?  ? ?  ? ?TAKE these medications   ? ?amLODipine 5 MG tablet ?Commonly known as: NORVASC ?Take 1 tablet (5 mg total) by mouth daily. ?  ?ibuprofen 100 MG/5ML suspension ?Commonly known as: ADVIL ?Take 30 mLs (600 mg total) by mouth every 6 (six) hours. ?  ?ondansetron 4 MG disintegrating tablet ?Commonly known as: ZOFRAN-ODT ?Take 1 tablet (4 mg total) by mouth every 6 (six) hours as needed for nausea. ?  ?oxyCODONE-acetaminophen 5-325 MG/5ML solution ?Commonly known as: ROXICET ?Take 5 mLs by mouth every 6 (six) hours as needed for up to 7 days for severe pain (breakthrough pain). ?  ? ?   ? ? ? ?Follow-up arrangements:  ? Follow-up Information   ? ? Will Bonnet, MD. Schedule an appointment as soon as possible for a visit in 1 week(s).   ?Specialty: Obstetrics and Gynecology ?Why: For incision check ?Contact information: ?[address] ?[place] [address] ?[phone] ? ? ?  ?  ? ?  ?  ? ?  ? ? ? ?Discharge Disposition: ?Discharge disposition: 01-Home or Self Care ?  ?The patient was evaluated prior to discharge with the aid of an ASL interpreter.   ? ? ?Signed: ?  Prentice Docker, MD  ?11/14/2021 8:29 AM   ?

## 2021-11-14 NOTE — Progress Notes (Signed)
Patient discharged home. Discharge instructions and prescriptions given and reviewed with patient.  ? ?Follow-up appointment scheduled for Tuesday, 5/23 at 11:30am with Dr. Glennon Mac at [facility]!  ? ?Patient verbalized understanding.  ? ?Took time with ASL interpreter going over proper ways to get out of bed and get to the restroom. Plans to stay with friend who does not have stairs for a few days. Her apartment has 3 flights of stairs and she is nervous to climb those when she gets home.  ? ?Provided patient with extra pads, mesh underwear, and wipes to use. Also explained importance of keeping sites clean and looking at them once/twice daily in front of a mirror.  ? ?IV removed.  ? ?Will be escorted out by volunteers. ?

## 2021-11-14 NOTE — Progress Notes (Signed)
All paperwork went over with ASL interpreter and also with friend (whom she will be living with). She is just waiting on her ride now.  ?

## 2021-11-15 NOTE — Anesthesia Postprocedure Evaluation (Signed)
Anesthesia Post Note  Patient: [name]  Procedure(s) Performed: XI ROBOTIC ASSISTED TOTAL HYSTERECTOMY WITH BILATERAL SALPINGO OOPHORECTOMY (Bilateral) CYSTOSCOPY  Patient location during evaluation: PACU Anesthesia Type: General Level of consciousness: awake and alert Pain management: pain level controlled Vital Signs Assessment: post-procedure vital signs reviewed and stable Respiratory status: spontaneous breathing, nonlabored ventilation, respiratory function stable and patient connected to nasal cannula oxygen Cardiovascular status: blood pressure returned to baseline and stable Postop Assessment: no apparent nausea or vomiting Anesthetic complications: no   No notable events documented.   Last Vitals:  Vitals:   11/14/21 0831 11/14/21 1138  BP: 126/85 135/87  Pulse: 82 72  Resp: 18 18  Temp: 36.7 C 37 C  SpO2: 100%     Last Pain:  Vitals:   11/14/21 1138  TempSrc: Oral  PainSc: 2                  Molli Barrows

## 2021-11-16 ENCOUNTER — Ambulatory Visit: Payer: Self-pay | Admitting: *Deleted

## 2021-11-16 DIAGNOSIS — R1084 Generalized abdominal pain: Secondary | ICD-10-CM | POA: Diagnosis not present

## 2021-11-16 DIAGNOSIS — I10 Essential (primary) hypertension: Secondary | ICD-10-CM | POA: Diagnosis not present

## 2021-11-16 DIAGNOSIS — Z90710 Acquired absence of both cervix and uterus: Secondary | ICD-10-CM | POA: Diagnosis not present

## 2021-11-16 NOTE — Telephone Encounter (Signed)
Sign language interpreter used   Chief Complaint: upper and lower abdominal pain  Symptoms: upper and lower abdominal pain cramping , severe pain when walking, feels constipated pain radiates to back s/p surgery 11/13/21 Frequency: 11/13/21 Pertinent Negatives: Patient denies fever, no chest pain , no difficulty breathing, no vomiting Disposition: '[x]'$ ED /'[]'$ Urgent Care (no appt availability in office) / '[]'$ Appointment(In office/virtual)/ '[]'$  [place] [place] [facility]/ '[]'$ Home Care/ '[]'$ Refused Recommended Disposition /'[]'$ [place] [facility]/ '[]'$  Follow-up with PCP Additional Notes:   Recommend ED due to patient having pain walking . Patient requesting to come to clinic.  Patient # [phone]    Reason for Disposition  [1] SEVERE pain (e.g., excruciating) AND [2] present > 1 hour  Answer Assessment - Initial Assessment Questions 1. LOCATION: "Where does it hurt?"      Upper and lower pain  2. RADIATION: "Does the pain shoot anywhere else?" (e.g., chest, back)     Back  3. ONSET: "When did the pain begin?" (e.g., minutes, hours or days ago)      Wednesday  11/13/21 4. SUDDEN: "Gradual or sudden onset?"     Na  5. PATTERN "Does the pain come and go, or is it constant?"    - If constant: "Is it getting better, staying the same, or worsening?"      (Note: Constant means the pain never goes away completely; most serious pain is constant and it progresses)     - If intermittent: "How long does it last?" "Do you have pain now?"     (Note: Intermittent means the pain goes away completely between bouts)     Comes and goes . Severe pain difficulty walking  6. SEVERITY: "How bad is the pain?"  (e.g., Scale 1-10; mild, moderate, or severe)    - MILD (1-3): doesn't interfere with normal activities, abdomen soft and not tender to touch     - MODERATE (4-7): interferes with normal activities or awakens from sleep, abdomen tender to touch     - SEVERE (8-10): excruciating pain, doubled over, unable to do  any normal activities       Severe difficulty walking  7. RECURRENT SYMPTOM: "Have you ever had this type of stomach pain before?" If Yes, ask: "When was the last time?" and "What happened that time?"      na 8. AGGRAVATING FACTORS: "Does anything seem to cause this pain?" (e.g., foods, stress, alcohol)     Movement  9. CARDIAC SYMPTOMS: "Do you have any of the following symptoms: chest pain, difficulty breathing, sweating, nausea?"     Pain with breathing in  10. OTHER SYMPTOMS: "Do you have any other symptoms?" (e.g., back pain, diarrhea, fever, urination pain, vomiting)       Constipation, upper and lower abdominal pain  11. PREGNANCY: "Is there any chance you are pregnant?" "When was your last menstrual period?"       na  Protocols used: Abdominal Pain - Upper-A-AH

## 2021-11-16 NOTE — Telephone Encounter (Signed)
Pt called again regarding abdominal pain and inability to have a BM.   Pt states she does not drive and so did not go to ED as instructed.   Referred pt back to ED. Pt states she will go when her friend comes home or she will call 911 for transportation.

## 2021-11-16 NOTE — Telephone Encounter (Signed)
This encounter was created in error - please disregard.

## 2022-04-22 NOTE — Progress Notes (Signed)
BP (!) 146/86   Pulse 74   Temp 98.4 F (36.9 C) (Oral)   Wt 149 lb 1.6 oz (67.6 kg)   LMP 10/22/2021 (Approximate)   SpO2 98%   BMI 26.41 kg/m    Subjective:    Patient ID: Mandy Alexander, female    DOB: [birth year], 56 y.o.   MRN: [id number]  HPI: Mandy Alexander is a 56 y.o. female presenting on 04/23/2022 for comprehensive medical examination. Current medical complaints include:none  HYPERTENSION without Chronic Kidney Disease Hypertension status: uncontrolled  Satisfied with current treatment? yes Duration of hypertension: years BP monitoring frequency:  not checking BP range:  BP medication side effects:  no Medication compliance: excellent compliance- however she is out of the medication as of today.  Previous BP meds:amlodipine Aspirin: no Recurrent headaches: no Visual changes: no Palpitations: no Dyspnea: no Chest pain: no Lower extremity edema: no Dizzy/lightheaded: no  HYPERTHYROIDISM Has not been on medication.  Labs have been well controlled.  Thyroid control status:controlled Satisfied with current treatment? yes Medication side effects: no Medication compliance:  not on medication Etiology of hypothyroidism:  Recent dose adjustment:no Fatigue: no Cold intolerance: no Heat intolerance: no Weight gain: no Weight loss: no Constipation: no Diarrhea/loose stools: no Palpitations: no Lower extremity edema: no Anxiety/depressed mood: no  Patient states she has been having some patches on her skin.  She was given cream that improved the itching but she is left with dry patches.  She stopped using the Triamcinolone cream once the other itching went away.  Now she is using regular lotion and the spots aren't going away.   She currently lives with: Menopausal Symptoms: no  Depression Screen done today and results listed below:     04/23/2022   10:03 AM 10/22/2021   10:10 AM 07/23/2021   10:29 AM 06/20/2021    8:29 AM 05/23/2020   10:47 AM   Depression screen PHQ 2/9  Decreased Interest 0 0 0 0 0  Down, Depressed, Hopeless 0 0 0 0 0  PHQ - 2 Score 0 0 0 0 0  Altered sleeping 0 0 0 0   Tired, decreased energy 0 0 0 0   Change in appetite 0 0 0 0   Feeling bad or failure about yourself  0 0 0 0   Trouble concentrating 0 0 0 0   Moving slowly or fidgety/restless 0 0 0 0   Suicidal thoughts 0 0 0 0   PHQ-9 Score 0 0 0 0   Difficult doing work/chores Not difficult at all Not difficult at all  Not difficult at all     The patient does not have a history of falls. I did complete a risk assessment for falls. A plan of care for falls was documented.   Past Medical History:  Past Medical History:  Diagnosis Date   Hypertension     Surgical History:  Past Surgical History:  Procedure Laterality Date   ABDOMINAL HYSTERECTOMY     CYSTOSCOPY N/A 11/13/2021   Procedure: CYSTOSCOPY;  Surgeon: Will Bonnet, MD;  Location: [facility];  Service: Gynecology;  Laterality: N/A;   NOSE SURGERY     ROBOTIC ASSISTED TOTAL HYSTERECTOMY WITH BILATERAL SALPINGO OOPHERECTOMY Bilateral 11/13/2021   Procedure: XI ROBOTIC ASSISTED TOTAL HYSTERECTOMY WITH BILATERAL SALPINGO OOPHORECTOMY;  Surgeon: Will Bonnet, MD;  Location: [facility];  Service: Gynecology;  Laterality: Bilateral;   TUBAL LIGATION     UTERINE FIBROID SURGERY  12/2020    Medications:  No current outpatient medications on file prior to visit.   No current facility-administered medications on file prior to visit.    Allergies:  Allergies  Allergen Reactions   Other Rash    Nuts and bananas. Causes upset stomach    Social History:  Social History   Socioeconomic History   Marital status: Married    Spouse name: Not on file   Number of children: Not on file   Years of education: Not on file   Highest education level: Not on file  Occupational History   Not on file  Tobacco Use   Smoking status: Never   Smokeless tobacco: Never  Vaping Use   Vaping  Use: Never used  Substance and Sexual Activity   Alcohol use: Not Currently   Drug use: Never   Sexual activity: Yes    Birth control/protection: Post-menopausal  Other Topics Concern   Not on file  Social History Narrative   Not on file   Social Determinants of Health   Financial Resource Strain: Not on file  Food Insecurity: Not on file  Transportation Needs: Not on file  Physical Activity: Not on file  Stress: Not on file  Social Connections: Not on file  Intimate Partner Violence: Not on file   Social History   Tobacco Use  Smoking Status Never  Smokeless Tobacco Never   Social History   Substance and Sexual Activity  Alcohol Use Not Currently    Family History:  Family History  Problem Relation Age of Onset   Drug abuse Father    Thyroid disease Sister    Colon cancer Maternal Grandmother     Past medical history, surgical history, medications, allergies, family history and social history reviewed with patient today and changes made to appropriate areas of the chart.   Review of Systems  Eyes:  Negative for blurred vision and double vision.  Respiratory:  Negative for shortness of breath.   Cardiovascular:  Negative for chest pain, palpitations and leg swelling.  Skin:  Positive for itching and rash.  Neurological:  Negative for dizziness and headaches.   All other ROS negative except what is listed above and in the HPI.      Objective:    BP (!) 146/86   Pulse 74   Temp 98.4 F (36.9 C) (Oral)   Wt 149 lb 1.6 oz (67.6 kg)   LMP 10/22/2021 (Approximate)   SpO2 98%   BMI 26.41 kg/m   Wt Readings from Last 3 Encounters:  04/23/22 149 lb 1.6 oz (67.6 kg)  11/13/21 140 lb (63.5 kg)  11/05/21 150 lb 11.2 oz (68.4 kg)    Physical Exam Vitals and nursing note reviewed.  Constitutional:      General: She is awake. She is not in acute distress.    Appearance: Normal appearance. She is well-developed and normal weight. She is not ill-appearing.   HENT:     Head: Normocephalic and atraumatic.     Right Ear: Hearing, tympanic membrane, ear canal and external ear normal. No drainage.     Left Ear: Hearing, tympanic membrane, ear canal and external ear normal. No drainage.     Nose: Nose normal.     Right Sinus: No maxillary sinus tenderness or frontal sinus tenderness.     Left Sinus: No maxillary sinus tenderness or frontal sinus tenderness.     Mouth/Throat:     Mouth: Mucous membranes are moist.     Pharynx: Oropharynx is clear. Uvula midline. No  pharyngeal swelling, oropharyngeal exudate or posterior oropharyngeal erythema.  Eyes:     General: Lids are normal.        Right eye: No discharge.        Left eye: No discharge.     Extraocular Movements: Extraocular movements intact.     Conjunctiva/sclera: Conjunctivae normal.     Pupils: Pupils are equal, round, and reactive to light.     Visual Fields: Right eye visual fields normal and left eye visual fields normal.  Neck:     Thyroid: No thyromegaly.     Vascular: No carotid bruit.     Trachea: Trachea normal.  Cardiovascular:     Rate and Rhythm: Normal rate and regular rhythm.     Heart sounds: Normal heart sounds. No murmur heard.    No gallop.  Pulmonary:     Effort: Pulmonary effort is normal. No accessory muscle usage or respiratory distress.     Breath sounds: Normal breath sounds.  Chest:  Breasts:    Right: Normal.     Left: Normal.  Abdominal:     General: Bowel sounds are normal.     Palpations: Abdomen is soft. There is no hepatomegaly or splenomegaly.     Tenderness: There is no abdominal tenderness.  Musculoskeletal:        General: Normal range of motion.     Cervical back: Normal range of motion and neck supple.     Right lower leg: No edema.     Left lower leg: No edema.  Lymphadenopathy:     Head:     Right side of head: No submental, submandibular, tonsillar, preauricular or posterior auricular adenopathy.     Left side of head: No submental,  submandibular, tonsillar, preauricular or posterior auricular adenopathy.     Cervical: No cervical adenopathy.     Upper Body:     Right upper body: No supraclavicular, axillary or pectoral adenopathy.     Left upper body: No supraclavicular, axillary or pectoral adenopathy.  Skin:    General: Skin is warm and dry.     Capillary Refill: Capillary refill takes less than 2 seconds.     Findings: No rash.       Neurological:     Mental Status: She is alert and oriented to person, place, and time.     Gait: Gait is intact.     Deep Tendon Reflexes: Reflexes are normal and symmetric.     Reflex Scores:      Brachioradialis reflexes are 2+ on the right side and 2+ on the left side.      Patellar reflexes are 2+ on the right side and 2+ on the left side. Psychiatric:        Attention and Perception: Attention normal.        Mood and Affect: Mood normal.        Speech: Speech normal.        Behavior: Behavior normal. Behavior is cooperative.        Thought Content: Thought content normal.        Judgment: Judgment normal.     Results for orders placed or performed during the hospital encounter of 11/13/21  Pregnancy, urine POC  Result Value Ref Range   Preg Test, Ur NEGATIVE NEGATIVE  Surgical pathology  Result Value Ref Range   SURGICAL PATHOLOGY      SURGICAL PATHOLOGY CASE: [id number] PATIENT: Mandy Alexander Surgical Pathology Report     Specimen Submitted: A. Uterus,  cervix, bilateral fallopian tubes, ovaries  Clinical History: Menorrhagia with irregular cycles     DIAGNOSIS: A. UTERUS WITH CERVIX; HYSTERECTOMY: - CERVIX NEGATIVE FOR INTRAEPITHELIAL LESION AND MALIGNANCY. - PROLIFERATIVE ENDOMETRIUM NEGATIVE FOR ATYPIA/EIN. 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 - ADENOMYOSIS.  BILATERAL FALLOPIAN TUBES AND OVARIES; SALPINGO-OOPHORECTOMY: - HEMORRHAGIC FOLLICULAR OVARIAN CYST, LEFT. - PARATUBAL CYSTS, LEFT.  SEPARATE PIECE OF SQUAMOUS-LINED  MUCOSA, 2.9 x 1.4 x 1.2 CM, CONTAINING A 0.6 CM SIMPLE MUCINOUS CYST, NEGATIVE FOR MALIGNANCY.  GROSS DESCRIPTION: A. Labeled: Uterus, cervix, bilateral fallopian tubes and ovaries Received: Fresh Collection time: 10:41 AM on 11/13/2021 Placed into formalin time: 11:35 AM on 11/13/2021 Weight: 150 grams Dimensions:      Fundus -10.9 (superior  to inferior) x 5.9 (breadth of uterus at fundus) x 5.2 (anterior to posterior) cm      Cervix -4.4 x 3.7 cm Serosa: The serosa is red-tan and smooth with scattered areas of hemorrhage. Cervix: The cervix is red-pink, smooth, and pearly. Endocervix: The endocervix is tan, mucoid, and finely granular with a 1.7 cm slitlike endocervical os.  There are multiple unilocular cysts, up to 1.5 cm in greatest dimension.  The cysts have smooth walls and contain clear to yellow gelatinous contents.  The surrounding cauterized areas are inked as follows: Anterior = blue and posterior = black. Endometrial cavity:      Dimensions -4.5 (superior to inferior) x 2.8 (cornu to cornu) cm      Thickness -Ranges from 0.1-0.4 cm      Other findings -the endometrium is red-brown and focally lush.  The endometrial cavity is distorted by multiple intramural nodules. Myometrium:     Thickness -0.2 cm     Other findings -the myometrium is pink-tan and focally trabeculated with 13 intramural no dules ranging from 0.6 to 2.4 cm in greatest dimension.  The nodules are tan-white, whorled, firm, and well-circumscribed. Adnexa:      Right ovary           Weight -0.95 grams           Measurement -1.9 x 1.2 x 0.7 cm           Serosa -the serosa is red-pink and cerebriform.           Cut surface -the cut surface is tan-pink and grossly unremarkable.      Right fallopian tube           Measurements -7.1 cm in length x 0.8 cm in diameter           Other findings -the fallopian tube is fimbriated with a red-pink, smooth, and glistening serosa.  There is a 1.3 x 0.8 x 0.5  cm paratubal cyst.  The lumen is centrally disjointed, consistent with a prior tubal ligation.  The remaining lumen is pinpoint and patent.      Left ovary            Weight -5.76 grams           Measurement -3 x 2.1 x 1.8 cm           Serosa -the serosa is purple-pink and smooth.           Cut surface -most of the cut surface is comprised of a 2.4 x 1.8 x 1.7 cm unilocular cyst.  The cyst has smoot h walls and contains hemorrhagic serous contents with blood clot.  There is a 0.3 x 0.3 x 0.2 cm white cystlike area within the main  cyst wall.  There is a peripheral rim of remaining ovarian parenchyma which is mottled tan and red.      Left fallopian tube            Measurements -4.5 cm in length x 0.7 cm in diameter           Other findings -the fallopian tube is fimbriated with a purple-red, smooth, and glistening serosa.  There are 2 paratubal cysts ranging from 0.5 to 1.1 cm in greatest dimension.  The lumen is centrally disjointed, consistent with a prior tubal ligation.  The remaining lumen is pinpoint and patent. Other comments: Additionally received in the same container is a 2.9 x 1.4 x 1.2 cm portion of white-tan finely wrinkled soft tissue.  The resection margin is inked green and the specimen is serially sectioned. Sectioning reveals tan-pink soft tissue with a single unilocular cyst, 0.6 x 0.5 x 0.4 cm.  The cyst contains clear gelatinous contents and has  a smooth wall.  Block summary: 1 - representative cervix/endocervix, 12:00 2 - representative cervix/endocervix, 3:00 3 - representative cervix/endocervix, 6:00 4 - representative cervix/endocervix, 9:00 5 - representative anterior transmural endomyometrium with trabeculated myometrium 6 - representative posterior transmural endomyometrium with lush endometrium 7 - 9 - representative intramural nodules 10 - right ovary, bisected and submitted entirely 11 - right fallopian tube fimbria, longitudinally bisected and  submitted entirely 12 - representative right fallopian tube cross-sections 13 - representative left ovary 14 - left fallopian tube fimbria, longitudinally bisected and submitted entirely 15 - representative left fallopian tube cross-sections 16 - representative sections of additional soft tissue (1/cm)  RB 11/13/2021   Final Diagnosis performed by Bryan Lemma, MD.   Electronically signed 11/14/2021 7:20:11PM The electronic signature indicates that the  named Attending Pathologist has evaluated the specimen Technical component performed at [facility], [address] [place], [place] [address] Lab: [phone] Dir: Rush Farmer, MD, MMM  Professional component performed at [facility], [facility], [place], [place], [place] [address] Lab: [phone] Dir: Kathi Simpers, MD       Assessment & Plan:   Problem List Items Addressed This Visit       Cardiovascular and Mediastinum   Essential hypertension    Chronic.  Elevated at visit today. Increased Amlodipine to '10mg'$ .  Would like patient to come back in 1 month but she will not be in the country for the next 6 months.  Labs ordered today.  Return to clinic in 6 months for reevaluation.  Call sooner if concerns arise.        Relevant Medications   amLODipine (NORVASC) 10 MG tablet   Other Relevant Orders   Comprehensive metabolic panel     Endocrine   Hyperthyroidism    Chronic.  Controlled without medication at this time.  Will recommend Endocrinology if needed. Labs ordered today.  Return to clinic in 6 months for reevaluation.  Call sooner if concerns arise.        Relevant Orders   T4, free   TSH   Other Visit Diagnoses     Annual physical exam    -  Primary   Health maintenance reviewed during visit today.  Labs ordered today.  Shingles given. Flu given. Will get Colonoscopy when she gets back in the country.   Relevant Orders   T4, free   CBC with Differential/Platelet   Comprehensive  metabolic panel   Lipid panel   TSH   Urinalysis, Routine w reflex microscopic  Screening for ischemic heart disease       Relevant Orders   Lipid panel   Need for shingles vaccine       Relevant Orders   Zoster Recombinant (Shingrix )   Need for influenza vaccination       Relevant Orders   Flu Vaccine QUAD 6+ mos PF IM (Fluarix Quad PF)        Follow up plan: Return in about 1 month (around 05/24/2022) for BP Check.   LABORATORY TESTING:  - Pap smear: not applicable  IMMUNIZATIONS:   - Tdap: Tetanus vaccination status reviewed: Will get at next visit. - Influenza: Administered today - Pneumovax: Not applicable - Prevnar: Not applicable - COVID: Up to date - HPV: Not applicable - Shingrix vaccine: Administered today  SCREENING: -Mammogram: Up to date  - Colonoscopy: Ordered today  - Bone Density: Not applicable  -Hearing Test: Not applicable  -Spirometry: Not applicable   PATIENT COUNSELING:   Advised to take 1 mg of folate supplement per day if capable of pregnancy.   Sexuality: Discussed sexually transmitted diseases, partner selection, use of condoms, avoidance of unintended pregnancy  and contraceptive alternatives.   Advised to avoid cigarette smoking.  I discussed with the patient that most people either abstain from alcohol or drink within safe limits (<=14/week and <=4 drinks/occasion for males, <=7/weeks and <= 3 drinks/occasion for females) and that the risk for alcohol disorders and other health effects rises proportionally with the number of drinks per week and how often a drinker exceeds daily limits.  Discussed cessation/primary prevention of drug use and availability of treatment for abuse.   Diet: Encouraged to adjust caloric intake to maintain  or achieve ideal body weight, to reduce intake of dietary saturated fat and total fat, to limit sodium intake by avoiding high sodium foods and not adding table salt, and to maintain adequate dietary  potassium and calcium preferably from fresh fruits, vegetables, and low-fat dairy products.    stressed the importance of regular exercise  Injury prevention: Discussed safety belts, safety helmets, smoke detector, smoking near bedding or upholstery.   Dental health: Discussed importance of regular tooth brushing, flossing, and dental visits.    NEXT PREVENTATIVE PHYSICAL DUE IN 1 YEAR. Return in about 1 month (around 05/24/2022) for BP Check.

## 2022-04-23 ENCOUNTER — Encounter: Payer: Self-pay | Admitting: Nurse Practitioner

## 2022-04-23 ENCOUNTER — Ambulatory Visit: Payer: Medicaid Other | Admitting: Nurse Practitioner

## 2022-04-23 VITALS — BP 146/86 | HR 74 | Temp 98.4°F | Wt 149.1 lb

## 2022-04-23 DIAGNOSIS — Z136 Encounter for screening for cardiovascular disorders: Secondary | ICD-10-CM

## 2022-04-23 DIAGNOSIS — E0590 Thyrotoxicosis, unspecified without thyrotoxic crisis or storm: Secondary | ICD-10-CM

## 2022-04-23 DIAGNOSIS — Z23 Encounter for immunization: Secondary | ICD-10-CM

## 2022-04-23 DIAGNOSIS — I10 Essential (primary) hypertension: Secondary | ICD-10-CM

## 2022-04-23 DIAGNOSIS — Z0000 Encounter for general adult medical examination without abnormal findings: Secondary | ICD-10-CM

## 2022-04-23 LAB — URINALYSIS, ROUTINE W REFLEX MICROSCOPIC
Bilirubin, UA: NEGATIVE
Glucose, UA: NEGATIVE
Ketones, UA: NEGATIVE
Leukocytes,UA: NEGATIVE
Nitrite, UA: NEGATIVE
Protein,UA: NEGATIVE
Specific Gravity, UA: 1.015 (ref 1.005–1.030)
Urobilinogen, Ur: 0.2 mg/dL (ref 0.2–1.0)
pH, UA: 7 (ref 5.0–7.5)

## 2022-04-23 LAB — MICROSCOPIC EXAMINATION: Bacteria, UA: NONE SEEN

## 2022-04-23 MED ORDER — AMLODIPINE BESYLATE 10 MG PO TABS: 10.0000 mg | ORAL_TABLET | Freq: Every day | ORAL | 0 refills | Status: DC

## 2022-04-23 MED ORDER — TRIAMCINOLONE ACETONIDE 0.1 % EX OINT: 1.0000 | TOPICAL_OINTMENT | Freq: Two times a day (BID) | CUTANEOUS | 0 refills | Status: AC

## 2022-04-23 MED ORDER — AMLODIPINE BESYLATE 10 MG PO TABS: 10.0000 mg | ORAL_TABLET | Freq: Every day | ORAL | 1 refills | Status: DC

## 2022-04-23 NOTE — Assessment & Plan Note (Signed)
Chronic.  Elevated at visit today. Increased Amlodipine to '10mg'$ .  Would like patient to come back in 1 month but she will not be in the country for the next 6 months.  Labs ordered today.  Return to clinic in 6 months for reevaluation.  Call sooner if concerns arise.

## 2022-04-23 NOTE — Assessment & Plan Note (Signed)
Chronic.  Controlled without medication at this time.  Will recommend Endocrinology if needed. Labs ordered today.  Return to clinic in 6 months for reevaluation.  Call sooner if concerns arise.

## 2022-04-24 LAB — CBC WITH DIFFERENTIAL/PLATELET
Basophils Absolute: 0 10*3/uL (ref 0.0–0.2)
Basos: 1 %
EOS (ABSOLUTE): 0.1 10*3/uL (ref 0.0–0.4)
Eos: 1 %
Hematocrit: 41.9 % (ref 34.0–46.6)
Hemoglobin: 13.8 g/dL (ref 11.1–15.9)
Immature Grans (Abs): 0 10*3/uL (ref 0.0–0.1)
Immature Granulocytes: 0 %
Lymphocytes Absolute: 1.3 10*3/uL (ref 0.7–3.1)
Lymphs: 19 %
MCH: 28.9 pg (ref 26.6–33.0)
MCHC: 32.9 g/dL (ref 31.5–35.7)
MCV: 88 fL (ref 79–97)
Monocytes Absolute: 0.4 10*3/uL (ref 0.1–0.9)
Monocytes: 6 %
Neutrophils Absolute: 5.1 10*3/uL (ref 1.4–7.0)
Neutrophils: 73 %
Platelets: 320 10*3/uL (ref 150–450)
RBC: 4.77 x10E6/uL (ref 3.77–5.28)
RDW: 13.8 % (ref 11.7–15.4)
WBC: 6.9 10*3/uL (ref 3.4–10.8)

## 2022-04-24 LAB — COMPREHENSIVE METABOLIC PANEL
ALT: 25 IU/L (ref 0–32)
AST: 21 IU/L (ref 0–40)
Albumin/Globulin Ratio: 1.5 (ref 1.2–2.2)
Albumin: 4.6 g/dL (ref 3.8–4.9)
Alkaline Phosphatase: 140 IU/L — ABNORMAL HIGH (ref 44–121)
BUN/Creatinine Ratio: 15 (ref 9–23)
BUN: 13 mg/dL (ref 6–24)
Bilirubin Total: 0.3 mg/dL (ref 0.0–1.2)
CO2: 25 mmol/L (ref 20–29)
Calcium: 9.6 mg/dL (ref 8.7–10.2)
Chloride: 104 mmol/L (ref 96–106)
Creatinine, Ser: 0.84 mg/dL (ref 0.57–1.00)
Globulin, Total: 3 g/dL (ref 1.5–4.5)
Glucose: 82 mg/dL (ref 70–99)
Potassium: 4.4 mmol/L (ref 3.5–5.2)
Sodium: 142 mmol/L (ref 134–144)
Total Protein: 7.6 g/dL (ref 6.0–8.5)
eGFR: 83 mL/min/{1.73_m2} (ref >59)

## 2022-04-24 LAB — LIPID PANEL
Chol/HDL Ratio: 2.7 ratio (ref 0.0–4.4)
Cholesterol, Total: 158 mg/dL (ref 100–199)
HDL: 59 mg/dL (ref >39)
LDL Chol Calc (NIH): 86 mg/dL (ref 0–99)
Triglycerides: 63 mg/dL (ref 0–149)
VLDL Cholesterol Cal: 13 mg/dL (ref 5–40)

## 2022-04-24 LAB — TSH: TSH: 1.62 u[IU]/mL (ref 0.450–4.500)

## 2022-04-24 LAB — T4, FREE: Free T4: 1.63 ng/dL (ref 0.82–1.77)

## 2022-04-24 NOTE — Progress Notes (Signed)
HI Mandy Alexander.  It was nice to see you yesterday.  Your lab work looks good.  No concerns at this time. Continue with your current medication regimen.  Follow up as discussed.  Please let me know if you have any questions.

## 2022-11-11 ENCOUNTER — Ambulatory Visit: Payer: Medicaid Other | Admitting: Nurse Practitioner

## 2022-11-11 VITALS — BP 153/92 | HR 69 | Temp 98.7°F | Wt 141.9 lb

## 2022-11-11 DIAGNOSIS — J302 Other seasonal allergic rhinitis: Secondary | ICD-10-CM

## 2022-11-11 DIAGNOSIS — E0590 Thyrotoxicosis, unspecified without thyrotoxic crisis or storm: Secondary | ICD-10-CM | POA: Diagnosis not present

## 2022-11-11 DIAGNOSIS — I10 Essential (primary) hypertension: Secondary | ICD-10-CM

## 2022-11-11 MED ORDER — AMLODIPINE BESYLATE 10 MG PO TABS
10.0000 mg | ORAL_TABLET | Freq: Every day | ORAL | 0 refills | Status: DC
Start: 2022-11-11 — End: 2022-12-26

## 2022-11-11 MED ORDER — CETIRIZINE HCL 10 MG PO TABS: 10.0000 mg | ORAL_TABLET | Freq: Every day | ORAL | 11 refills | Status: AC

## 2022-11-11 NOTE — Assessment & Plan Note (Signed)
Chronic. Elevated at visit today.  Patient has been out of medication for 3 weeks.  Will restart amlodipine.  Follow up in 1 month. Call sooner if concerns arise.

## 2022-11-11 NOTE — Progress Notes (Signed)
BP (!) 153/92   Pulse 69   Temp 98.7 F (37.1 C) (Oral)   Wt 141 lb 14.4 oz (64.4 kg)   LMP 10/22/2021 (Approximate)   SpO2 99%   BMI 25.14 kg/m    Subjective:    Patient ID: Mandy Alexander, female    DOB: [birth year], 57 y.o.   MRN: [id number]  HPI: Mandy Alexander is a 57 y.o. female  Chief Complaint  Patient presents with   Medication Refill   HYPERTENSION Patient states she has been out of her amlodipine for about 3 weeks.   Hypertension status: uncontrolled Satisfied with current treatment? no Duration of hypertension: years BP monitoring frequency:  not checking BP range:  BP medication side effects:  no Medication compliance: fair compliance Previous BP meds:amlodipine Aspirin: no Recurrent headaches: no Visual changes: no Palpitations: no Dyspnea: no Chest pain: no Lower extremity edema: no Dizzy/lightheaded: no  UPPER RESPIRATORY TRACT INFECTION Worst symptom: symptoms started 2 weeks ago Fever: no Cough: yes Shortness of breath: no Wheezing: no Chest pain: no Chest tightness: yes Chest congestion: no Nasal congestion: yes Runny nose: no Post nasal drip: no Sneezing: yes Sore throat: no Swollen glands: no Sinus pressure: no Headache: no Face pain: no Toothache: no Ear pain: no bilateral Ear pressure: no bilateral Eyes red/itching:no Eye drainage/crusting: no  Vomiting: no Rash: no Fatigue: no Sick contacts: no Strep contacts: no  Context: stable Recurrent sinusitis: no Relief with OTC cold/cough medications: no  Treatments attempted: none    THYROID DISEASE Thyroid control status:controlled Satisfied with current treatment? yes Medication side effects: no Medication compliance: excellent compliance Etiology of hypothyroidism:  Recent dose adjustment:no Fatigue: no Cold intolerance: no Heat intolerance: no Weight gain: no Weight loss: no Constipation: no Diarrhea/loose stools: no Palpitations: no Lower extremity edema:  no Anxiety/depressed mood: no   Relevant past medical, surgical, family and social history reviewed and updated as indicated. Interim medical history since our last visit reviewed. Allergies and medications reviewed and updated.  Review of Systems  Constitutional:  Negative for fatigue, fever and unexpected weight change.  HENT:  Positive for congestion, rhinorrhea and sneezing. Negative for dental problem, ear pain, postnasal drip, sinus pressure, sinus pain and sore throat.   Eyes:  Negative for visual disturbance.  Respiratory:  Positive for cough and chest tightness. Negative for shortness of breath and wheezing.   Cardiovascular:  Negative for chest pain, palpitations and leg swelling.  Gastrointestinal:  Negative for vomiting.  Endocrine: Negative for cold intolerance.  Skin:  Negative for rash.  Neurological:  Negative for dizziness and headaches.    Per HPI unless specifically indicated above     Objective:    BP (!) 153/92   Pulse 69   Temp 98.7 F (37.1 C) (Oral)   Wt 141 lb 14.4 oz (64.4 kg)   LMP 10/22/2021 (Approximate)   SpO2 99%   BMI 25.14 kg/m   Wt Readings from Last 3 Encounters:  11/11/22 141 lb 14.4 oz (64.4 kg)  04/23/22 149 lb 1.6 oz (67.6 kg)  11/13/21 140 lb (63.5 kg)    Physical Exam Vitals and nursing note reviewed.  Constitutional:      General: She is not in acute distress.    Appearance: Normal appearance. She is normal weight. She is not ill-appearing, toxic-appearing or diaphoretic.  HENT:     Head: Normocephalic.     Right Ear: Tympanic membrane and external ear normal.     Left Ear: Tympanic membrane and external ear  normal.     Nose: Congestion and rhinorrhea present.     Mouth/Throat:     Mouth: Mucous membranes are moist.     Pharynx: Oropharynx is clear. Posterior oropharyngeal erythema present. No oropharyngeal exudate.  Eyes:     General:        Right eye: No discharge.        Left eye: No discharge.     Extraocular  Movements: Extraocular movements intact.     Conjunctiva/sclera: Conjunctivae normal.     Pupils: Pupils are equal, round, and reactive to light.  Cardiovascular:     Rate and Rhythm: Normal rate and regular rhythm.     Heart sounds: No murmur heard. Pulmonary:     Effort: Pulmonary effort is normal. No respiratory distress.     Breath sounds: Normal breath sounds. No wheezing or rales.  Musculoskeletal:     Cervical back: Normal range of motion and neck supple.  Skin:    General: Skin is warm and dry.     Capillary Refill: Capillary refill takes less than 2 seconds.       Neurological:     General: No focal deficit present.     Mental Status: She is alert and oriented to person, place, and time. Mental status is at baseline.  Psychiatric:        Mood and Affect: Mood normal.        Behavior: Behavior normal.        Thought Content: Thought content normal.        Judgment: Judgment normal.     Results for orders placed or performed in visit on 04/23/22  Microscopic Examination   Urine  Result Value Ref Range   WBC, UA 0-5 0 - 5 /hpf   RBC, Urine 0-2 0 - 2 /hpf   Epithelial Cells (non renal) 0-10 0 - 10 /hpf   Bacteria, UA None seen None seen/Few  T4, free  Result Value Ref Range   Free T4 1.63 0.82 - 1.77 ng/dL  CBC with Differential/Platelet  Result Value Ref Range   WBC 6.9 3.4 - 10.8 x10E3/uL   RBC 4.77 3.77 - 5.28 x10E6/uL   Hemoglobin 13.8 11.1 - 15.9 g/dL   Hematocrit 16.1 09.6 - 46.6 %   MCV 88 79 - 97 fL   MCH 28.9 26.6 - 33.0 pg   MCHC 32.9 31.5 - 35.7 g/dL   RDW 04.5 40.9 - 81.1 %   Platelets 320 150 - 450 x10E3/uL   Neutrophils 73 Not Estab. %   Lymphs 19 Not Estab. %   Monocytes 6 Not Estab. %   Eos 1 Not Estab. %   Basos 1 Not Estab. %   Neutrophils Absolute 5.1 1.4 - 7.0 x10E3/uL   Lymphocytes Absolute 1.3 0.7 - 3.1 x10E3/uL   Monocytes Absolute 0.4 0.1 - 0.9 x10E3/uL   EOS (ABSOLUTE) 0.1 0.0 - 0.4 x10E3/uL   Basophils Absolute 0.0 0.0 - 0.2  x10E3/uL   Immature Granulocytes 0 Not Estab. %   Immature Grans (Abs) 0.0 0.0 - 0.1 x10E3/uL  Comprehensive metabolic panel  Result Value Ref Range   Glucose 82 70 - 99 mg/dL   BUN 13 6 - 24 mg/dL   Creatinine, Ser 9.14 0.57 - 1.00 mg/dL   eGFR 83 >78 GN/FAO/1.30   BUN/Creatinine Ratio 15 9 - 23   Sodium 142 134 - 144 mmol/L   Potassium 4.4 3.5 - 5.2 mmol/L   Chloride 104 96 - 106 mmol/L  CO2 25 20 - 29 mmol/L   Calcium 9.6 8.7 - 10.2 mg/dL   Total Protein 7.6 6.0 - 8.5 g/dL   Albumin 4.6 3.8 - 4.9 g/dL   Globulin, Total 3.0 1.5 - 4.5 g/dL   Albumin/Globulin Ratio 1.5 1.2 - 2.2   Bilirubin Total 0.3 0.0 - 1.2 mg/dL   Alkaline Phosphatase 140 (H) 44 - 121 IU/L   AST 21 0 - 40 IU/L   ALT 25 0 - 32 IU/L  Lipid panel  Result Value Ref Range   Cholesterol, Total 158 100 - 199 mg/dL   Triglycerides 63 0 - 149 mg/dL   HDL 59 >16 mg/dL   VLDL Cholesterol Cal 13 5 - 40 mg/dL   LDL Chol Calc (NIH) 86 0 - 99 mg/dL   Chol/HDL Ratio 2.7 0.0 - 4.4 ratio  TSH  Result Value Ref Range   TSH 1.620 0.450 - 4.500 uIU/mL  Urinalysis, Routine w reflex microscopic  Result Value Ref Range   Specific Gravity, UA 1.015 1.005 - 1.030   pH, UA 7.0 5.0 - 7.5   Color, UA Yellow Yellow   Appearance Ur Clear Clear   Leukocytes,UA Negative Negative   Protein,UA Negative Negative/Trace   Glucose, UA Negative Negative   Ketones, UA Negative Negative   RBC, UA Trace (A) Negative   Bilirubin, UA Negative Negative   Urobilinogen, Ur 0.2 0.2 - 1.0 mg/dL   Nitrite, UA Negative Negative   Microscopic Examination See below:       Assessment & Plan:   Problem List Items Addressed This Visit       Cardiovascular and Mediastinum   Essential hypertension - Primary    Chronic. Elevated at visit today.  Patient has been out of medication for 3 weeks.  Will restart amlodipine.  Follow up in 1 month. Call sooner if concerns arise.       Relevant Medications   amLODipine (NORVASC) 10 MG tablet   Other  Relevant Orders   Comp Met (CMET)     Endocrine   Hyperthyroidism    Labs ordered at visit today.  Will make recommendations based on lab results. Has been well controlled without medication.         Relevant Orders   TSH   T4, free   Other Visit Diagnoses     Seasonal allergies       Recommend antihistamine daily.  Zyrtect sent to the pharmacy.  Side effects and benefits discussed during visit.        Follow up plan: Return in about 1 month (around 12/12/2022) for BP Check.

## 2022-11-11 NOTE — Assessment & Plan Note (Signed)
Labs ordered at visit today.  Will make recommendations based on lab results. Has been well controlled without medication.

## 2022-11-12 LAB — COMPREHENSIVE METABOLIC PANEL
ALT: 29 IU/L (ref 0–32)
AST: 25 IU/L (ref 0–40)
Albumin/Globulin Ratio: 1.4 (ref 1.2–2.2)
Albumin: 4.2 g/dL (ref 3.8–4.9)
Alkaline Phosphatase: 136 IU/L — ABNORMAL HIGH (ref 44–121)
BUN/Creatinine Ratio: 17 (ref 9–23)
BUN: 15 mg/dL (ref 6–24)
Bilirubin Total: 0.2 mg/dL (ref 0.0–1.2)
CO2: 26 mmol/L (ref 20–29)
Calcium: 9.4 mg/dL (ref 8.7–10.2)
Chloride: 104 mmol/L (ref 96–106)
Creatinine, Ser: 0.87 mg/dL (ref 0.57–1.00)
Globulin, Total: 3.1 g/dL (ref 1.5–4.5)
Glucose: 70 mg/dL (ref 70–99)
Potassium: 3.9 mmol/L (ref 3.5–5.2)
Sodium: 142 mmol/L (ref 134–144)
Total Protein: 7.3 g/dL (ref 6.0–8.5)
eGFR: 79 mL/min/{1.73_m2} (ref >59)

## 2022-11-12 LAB — T4, FREE: Free T4: 1.62 ng/dL (ref 0.82–1.77)

## 2022-11-12 LAB — TSH: TSH: 1.31 u[IU]/mL (ref 0.450–4.500)

## 2022-11-12 NOTE — Progress Notes (Signed)
Hi Joss. It was nice to see you yesterday.  Your lab work looks good.  No concerns at this time. Continue with your current medication regimen.  Follow up as discussed.  Please let me know if you have any questions.

## 2022-12-17 ENCOUNTER — Ambulatory Visit: Payer: Medicaid Other | Admitting: Nurse Practitioner

## 2022-12-25 ENCOUNTER — Other Ambulatory Visit: Payer: Self-pay | Admitting: Nurse Practitioner

## 2022-12-25 NOTE — Telephone Encounter (Signed)
Medication Refill - Medication: amLODipine (NORVASC) 10 MG tablet   Has the patient contacted their pharmacy? No. (Agent: If no, request that the patient contact the pharmacy for the refill. If patient does not wish to contact the pharmacy document the reason why and proceed with request.) (Agent: If yes, when and what did the pharmacy advise?)  Preferred Pharmacy (with phone number or street name):  [id number] - [place], [place] - [address] Phone: [phone]  Fax: [phone]     Has the patient been seen for an appointment in the last year OR does the patient have an upcoming appointment? Yes.    Agent: Please be advised that RX refills may take up to 3 business days. We ask that you follow-up with your pharmacy.

## 2022-12-26 ENCOUNTER — Ambulatory Visit: Payer: Self-pay | Admitting: *Deleted

## 2022-12-26 ENCOUNTER — Other Ambulatory Visit: Payer: Self-pay | Admitting: *Deleted

## 2022-12-26 NOTE — Telephone Encounter (Signed)
Requested medication (s) are due for refill today:   Provider to review.    Pt. Cancelled her F/U appt on 12/17/2022.  Requested medication (s) are on the active medication list:   Yes  Future visit scheduled:   No    Needs F/U per office note   Last ordered: 11/11/2022 #60, 1 refill  Provider to review since pt cancelled her F/U appt.   Requested Prescriptions  Pending Prescriptions Disp Refills   amLODipine (NORVASC) 10 MG tablet 60 tablet 0    Sig: Take 1 tablet (10 mg total) by mouth daily.     Cardiovascular: Calcium Channel Blockers 2 Failed - 12/25/2022  9:03 AM      Failed - Last BP in normal range    BP Readings from Last 1 Encounters:  11/11/22 (!) 153/92         Passed - Last Heart Rate in normal range    Pulse Readings from Last 1 Encounters:  11/11/22 69         Passed - Valid encounter within last 6 months    Recent Outpatient Visits           1 month ago Essential hypertension   [place] [facility] Larae Grooms, NP   8 months ago Annual physical exam   [place] [facility] Larae Grooms, NP   1 year ago Essential hypertension   [place] [facility] Larae Grooms, NP   1 year ago Essential hypertension   [place] [facility] Larae Grooms, NP   1 year ago Dyspnea on exertion   [place] [facility] Larae Grooms, NP

## 2022-12-26 NOTE — Telephone Encounter (Signed)
Medication Advice   Pts son is calling because the [place] is [place]. Needing a medication refill but needs to be in the state. Wanting to know is there an otc alternative for amLODipine (NORVASC) 10 MG tablet 60     [facility] [address] [facility] [place]  [id number]   Sent to [facility] refill pool.

## 2022-12-27 MED ORDER — AMLODIPINE BESYLATE 10 MG PO TABS
10.0000 mg | ORAL_TABLET | Freq: Every day | ORAL | 1 refills | Status: AC
Start: 2022-12-27 — End: ?

## 2022-12-27 NOTE — Telephone Encounter (Signed)
Requested Prescriptions  Pending Prescriptions Disp Refills   amLODipine (NORVASC) 10 MG tablet 90 tablet 1    Sig: Take 1 tablet (10 mg total) by mouth daily.     Cardiovascular: Calcium Channel Blockers 2 Failed - 12/26/2022  5:13 PM      Failed - Last BP in normal range    BP Readings from Last 1 Encounters:  11/11/22 (!) 153/92         Passed - Last Heart Rate in normal range    Pulse Readings from Last 1 Encounters:  11/11/22 69         Passed - Valid encounter within last 6 months    Recent Outpatient Visits           1 month ago Essential hypertension   [place] [facility] Larae Grooms, NP   8 months ago Annual physical exam   [place] [facility] Larae Grooms, NP   1 year ago Essential hypertension   [place] [facility] Larae Grooms, NP   1 year ago Essential hypertension   [place] [facility] Larae Grooms, NP   1 year ago Dyspnea on exertion   [place] [facility] Larae Grooms, NP

## 2023-01-18 IMAGING — CT CT ANGIO CHEST
2 of 7 series · 18 of 46 positions shown · IV contrast (APPLIED)
Comparison: Chest radiographs 06/27/2021. Outside CT abdomen and
pelvis report from 05/19/2020.

CLINICAL DATA: Dyspnea and chest pressure.

EXAM:
CT ANGIOGRAPHY CHEST WITH CONTRAST
TECHNIQUE: Multidetector CT imaging of the chest was performed using the
standard protocol during bolus administration of intravenous
contrast. Multiplanar CT image reconstructions and MIPs were
obtained to evaluate the vascular anatomy.
CONTRAST:  75mL OMNIPAQUE IOHEXOL 350 MG/ML SOLN

[Series 5: thins · axial · 0.67mm/px · z∈[-516,-244]mm · 16 of 309 slices shown]
[im 18/309  lung]
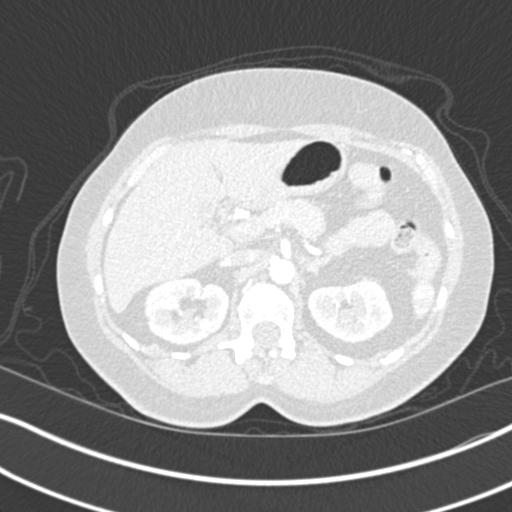
[im 35/309  soft-tissue]
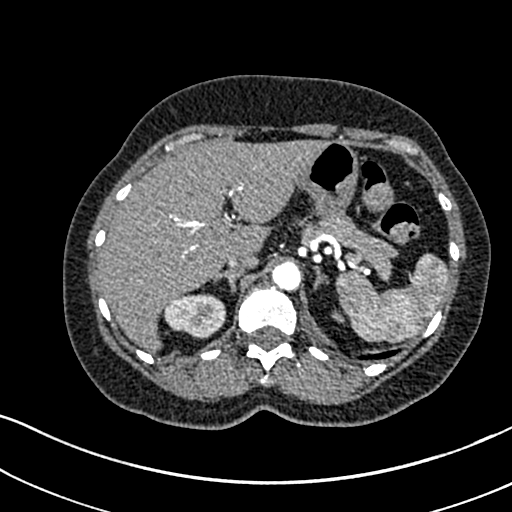
[im 52/309  lung]
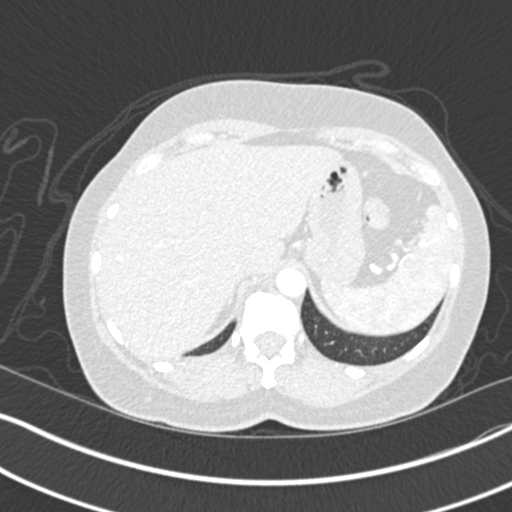
[im 69/309  soft-tissue]
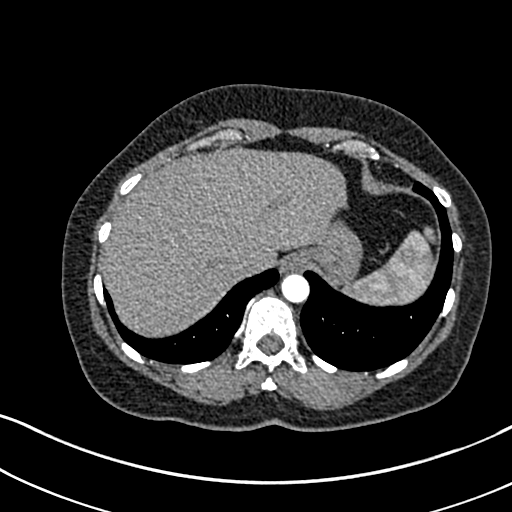
[im 86/309  lung]
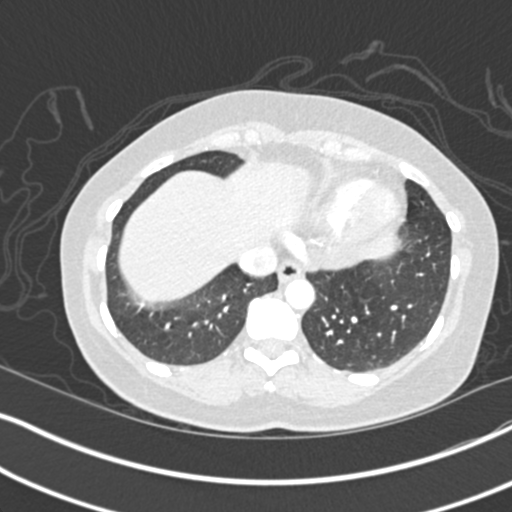
[im 103/309  soft-tissue]
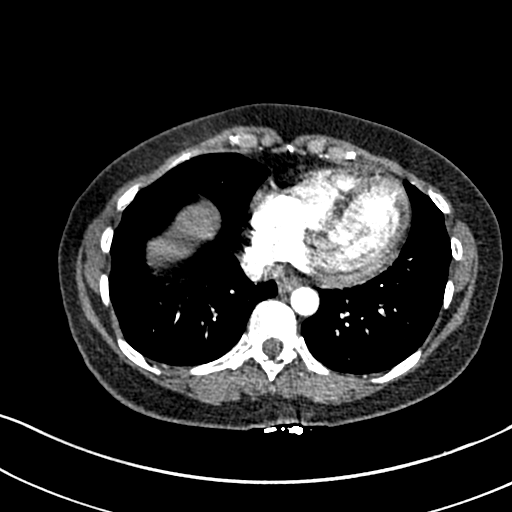
[im 120/309  lung]
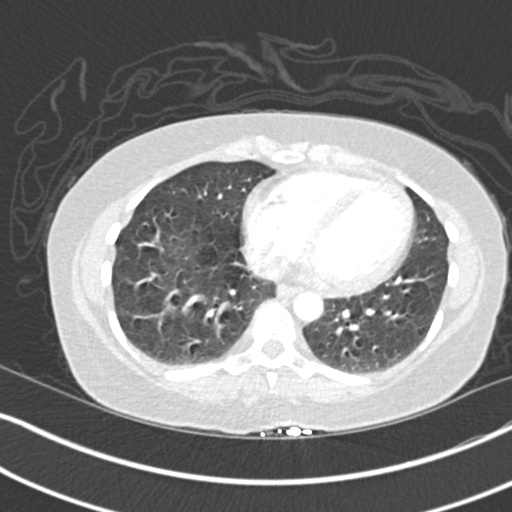
[im 137/309  soft-tissue]
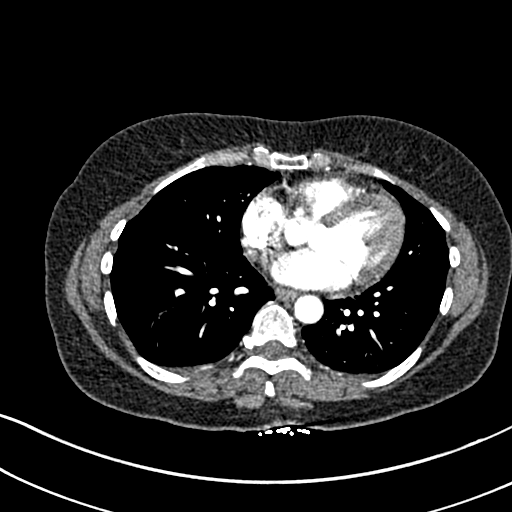
[im 172/309  lung]
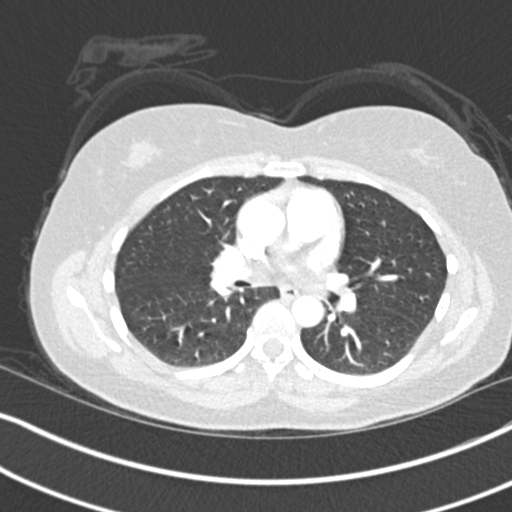
[im 189/309  soft-tissue]
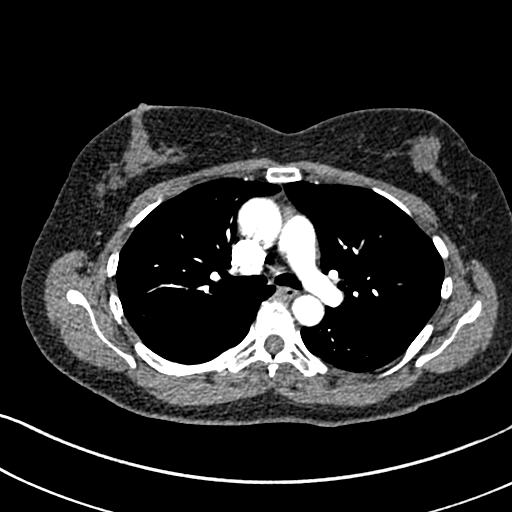
[im 206/309  lung]
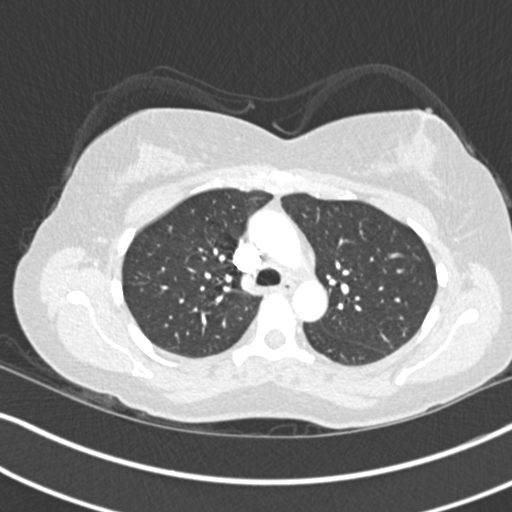
[im 223/309  soft-tissue]
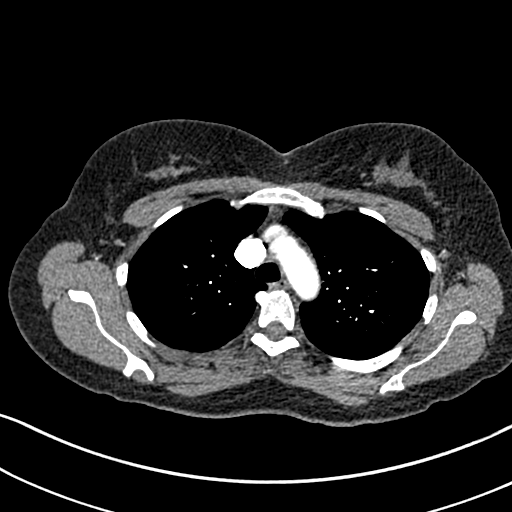
[im 240/309  lung]
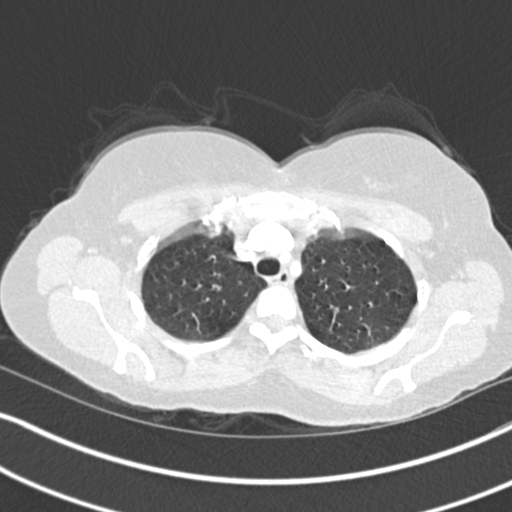
[im 257/309  soft-tissue]
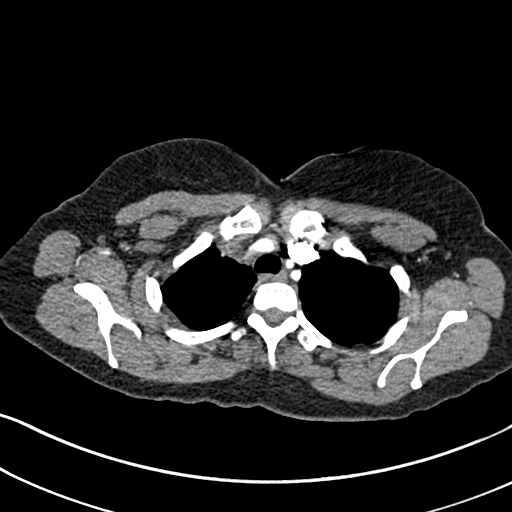
[im 274/309  lung]
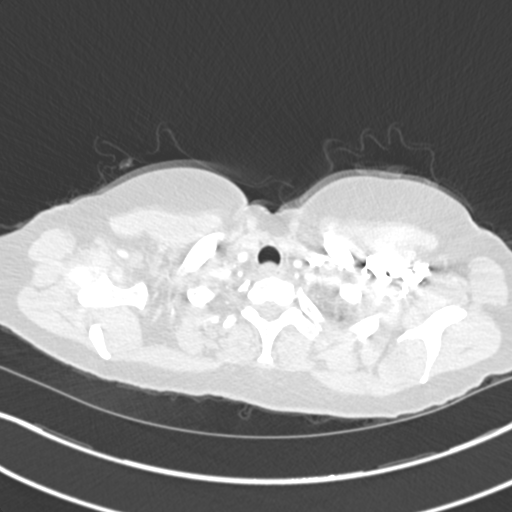
[im 291/309  soft-tissue]
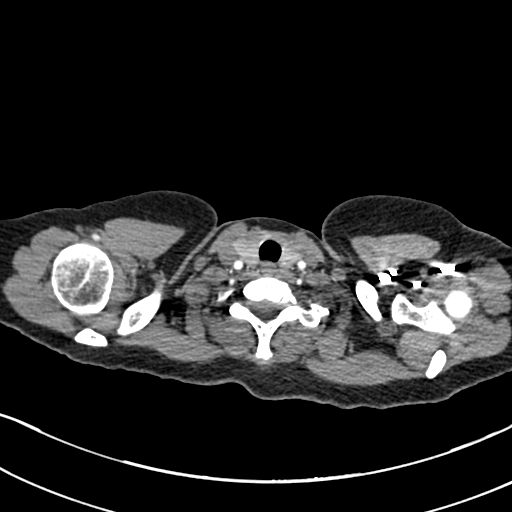

[Series 7: coronal mpr · coronal · 0.62mm/px · 2 of 84 slices shown]
[im 28/84  soft-tissue]
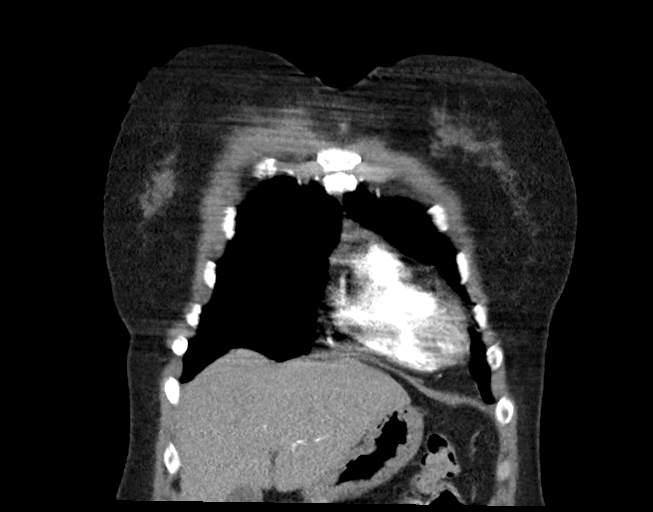
[im 56/84  soft-tissue]
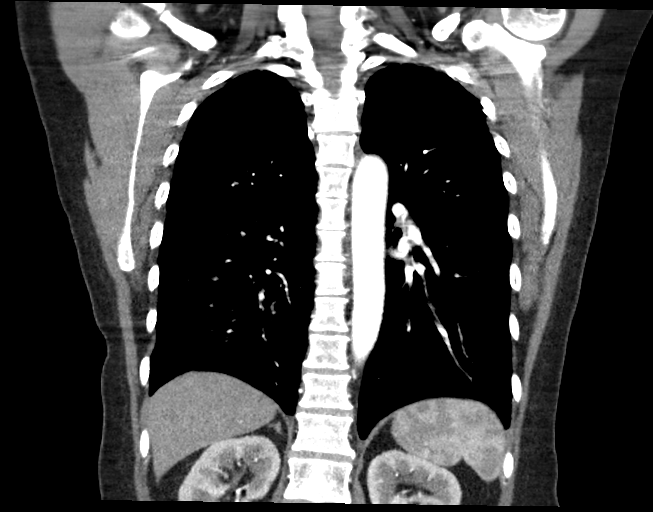

[18 of 46 positions shown; findings below may reference images not displayed]

FINDINGS: Cardiovascular: Pulmonary arterial opacification is adequate without
evidence of emboli although segmental and subsegmental assessment is
limited by moderate motion artifact primarily in the lower lobes.
There is no evidence of thoracic aortic dissection or aneurysm. The
heart is normal in size. There is no pericardial effusion.

Mediastinum/Nodes: Unremarkable included thyroid and esophagus. No
enlarged axillary, mediastinal, or hilar lymph nodes.

Lungs/Pleura: No pleural effusion or pneumothorax. No consolidation
or mass.

Upper Abdomen: 8 mm lesion in the right hepatic lobe with peripheral
hyperenhancement and questionable calcification (a 6 mm peripherally
enhancing lesion was also described in the prior outside CT report).

Musculoskeletal: No acute osseous abnormality or suspicious osseous
lesion.

Review of the MIP images confirms the above findings.
IMPRESSION: 1. No evidence of pulmonary emboli or other acute abnormality in the
chest.
2. 8 mm liver lesion, also described in a 3638 abdominal CT report.
This is favored to represent a benign hemangioma, however if the
patient has a history of malignancy or liver disease then abdominal
MRI or liver protocol CT should be considered for further
evaluation.
